# Patient Record
Sex: Male | Born: 1975 | Race: Black or African American | Hispanic: No | Marital: Married | Smoking: Current every day smoker
Health system: Southern US, Community
[De-identification: ages and names within clinical notes are randomized; demographics above are authoritative.]

## PROBLEM LIST (undated history)

## (undated) DIAGNOSIS — E119 Type 2 diabetes mellitus without complications: Secondary | ICD-10-CM

---

## 2005-06-05 ENCOUNTER — Emergency Department: Payer: Self-pay | Admitting: Emergency Medicine

## 2010-04-20 ENCOUNTER — Emergency Department: Payer: Self-pay | Admitting: Internal Medicine

## 2011-02-11 ENCOUNTER — Emergency Department: Payer: Self-pay | Admitting: Emergency Medicine

## 2014-03-28 ENCOUNTER — Emergency Department: Payer: Self-pay | Admitting: Emergency Medicine

## 2014-06-12 ENCOUNTER — Ambulatory Visit
Admission: EM | Admit: 2014-06-12 | Discharge: 2014-06-12 | Disposition: A | Discharge status: Home / Self Care | Payer: Self-pay | Attending: Family Medicine | Admitting: Family Medicine

## 2014-06-12 DIAGNOSIS — Z029 Encounter for administrative examinations, unspecified: Secondary | ICD-10-CM

## 2014-06-12 DIAGNOSIS — Z0289 Encounter for other administrative examinations: Secondary | ICD-10-CM

## 2014-06-12 LAB — DEPT OF TRANSP DIPSTICK, URINE (ARMC ONLY)
GLUCOSE, UA: NEGATIVE mg/dL
HGB URINE DIPSTICK: NEGATIVE
PROTEIN: NEGATIVE mg/dL
SPECIFIC GRAVITY, URINE: 1.03 (ref 1.005–1.030)

## 2014-06-12 NOTE — Discharge Instructions (Signed)
Recommend regular physicals, dental and vision checks.

## 2014-06-12 NOTE — ED Provider Notes (Signed)
SUBJECTIVE: Patient presents for DOT Physical. No acute problems.   OBJECTIVE: VItals and Exam on form   ASSESSMENT/PLAN: Form reviewed and filled out. Form will be scanned into system. Encouraged regular follow-up with PMD, dentist, and eye doctor. Recommended smoking cessation.   Paulina Fusi, MD 06/12/14 1113

## 2014-06-12 NOTE — ED Notes (Signed)
Here for DOT physical. No complaints.

## 2014-07-31 ENCOUNTER — Encounter (HOSPITAL_COMMUNITY): Payer: Self-pay | Admitting: *Deleted

## 2014-07-31 ENCOUNTER — Emergency Department (HOSPITAL_COMMUNITY)
Admission: EM | Admit: 2014-07-31 | Discharge: 2014-07-31 | Disposition: A | Discharge status: Home / Self Care | Payer: Self-pay | Attending: Emergency Medicine | Admitting: Emergency Medicine

## 2014-07-31 DIAGNOSIS — T675XXA Heat exhaustion, unspecified, initial encounter: Secondary | ICD-10-CM | POA: Insufficient documentation

## 2014-07-31 DIAGNOSIS — Y999 Unspecified external cause status: Secondary | ICD-10-CM | POA: Insufficient documentation

## 2014-07-31 DIAGNOSIS — Y9389 Activity, other specified: Secondary | ICD-10-CM | POA: Insufficient documentation

## 2014-07-31 DIAGNOSIS — R42 Dizziness and giddiness: Secondary | ICD-10-CM | POA: Insufficient documentation

## 2014-07-31 DIAGNOSIS — X30XXXA Exposure to excessive natural heat, initial encounter: Secondary | ICD-10-CM | POA: Insufficient documentation

## 2014-07-31 DIAGNOSIS — Y9289 Other specified places as the place of occurrence of the external cause: Secondary | ICD-10-CM | POA: Insufficient documentation

## 2014-07-31 DIAGNOSIS — R410 Disorientation, unspecified: Secondary | ICD-10-CM | POA: Insufficient documentation

## 2014-07-31 DIAGNOSIS — Z720 Tobacco use: Secondary | ICD-10-CM | POA: Insufficient documentation

## 2014-07-31 LAB — CBC
HEMATOCRIT: 46.3 % (ref 39.0–52.0)
Hemoglobin: 16 g/dL (ref 13.0–17.0)
MCH: 30.9 pg (ref 26.0–34.0)
MCHC: 34.6 g/dL (ref 30.0–36.0)
MCV: 89.6 fL (ref 78.0–100.0)
Platelets: 253 10*3/uL (ref 150–400)
RBC: 5.17 MIL/uL (ref 4.22–5.81)
RDW: 13.2 % (ref 11.5–15.5)
WBC: 19 10*3/uL — ABNORMAL HIGH (ref 4.0–10.5)

## 2014-07-31 LAB — BASIC METABOLIC PANEL
Anion gap: 10 (ref 5–15)
BUN: 11 mg/dL (ref 6–20)
CALCIUM: 9 mg/dL (ref 8.9–10.3)
CO2: 22 mmol/L (ref 22–32)
CREATININE: 1.25 mg/dL — AB (ref 0.61–1.24)
Chloride: 104 mmol/L (ref 101–111)
GFR calc non Af Amer: >60 mL/min (ref >60)
GLUCOSE: 137 mg/dL — AB (ref 65–99)
Potassium: 3.9 mmol/L (ref 3.5–5.1)
Sodium: 136 mmol/L (ref 135–145)

## 2014-07-31 MED ORDER — SODIUM CHLORIDE 0.9 % IV BOLUS (SEPSIS)
1000.0000 mL | INTRAVENOUS | Status: AC
  Administered 2014-07-31: 1000 mL via INTRAVENOUS

## 2014-07-31 NOTE — Discharge Instructions (Signed)
Heat-Related Illness °Heat-related illnesses occur when the body is unable to properly cool itself. The body normally cools itself by sweating. However, under some conditions sweating is not enough. In these cases, a person's body temperature rises rapidly. Very high body temperatures may damage the brain or other vital organs. Some examples of heat-related illnesses include: °· Heat stroke. This occurs when the body is unable to regulate its temperature. The body's temperature rises rapidly, the sweating mechanism fails, and the body is unable to cool down. Body temperature may rise to 106° F (41° C) or higher within 10 to 15 minutes. Heat stroke can cause death or permanent disability if emergency treatment is not provided. °· Heat exhaustion. This is a milder form of heat-related illness that can develop after several days of exposure to high temperatures and not enough fluids. It is the body's response to an excessive loss of the water and salt contained in sweat. °· Heat cramps. These usually affect people who sweat a lot during heavy activity. This sweating drains the body's salt and moisture. The low salt level in the muscles causes painful cramps. Heat cramps may also be a symptom of heat exhaustion. Heat cramps usually occur in the abdomen, arms, or legs. Get medical attention for cramps if you have heart problems or are on a low-sodium diet. °Those that are at greatest risk for heat-related illnesses include:  °· The elderly. °· Infant and the very young. °· People with mental illness and chronic diseases. °· People who are overweight (obese). °· Young and healthy people can even succumb to heat if they participate in strenuous physical activities during hot weather. °CAUSES  °Several factors affect the body's ability to cool itself during extremely hot weather. When the humidity is high, sweat will not evaporate as quickly. This prevents the body from releasing heat quickly. Other factors that can affect  the body's ability to cool down include:  °· Age. °· Obesity. °· Fever. °· Dehydration. °· Heart disease. °· Mental illness. °· Poor circulation. °· Sunburn. °· Prescription drug use. °· Alcohol use. °SYMPTOMS  °Heat stroke: Warning signs of heat stroke vary, but may include: °· An extremely high body temperature (above 103°F orally). °· A fast, strong pulse. °· Dizziness. °· Confusion. °· Red, hot, and dry skin. °· No sweating. °· Throbbing headache. °· Feeling sick to your stomach (nauseous). °· Unconsciousness. °Heat exhaustion: Warning signs of heat exhaustion include: °· Heavy sweating. °· Tiredness. °· Headache. °· Paleness. °· Weakness. °· Feeling sick to your stomach (nauseous) or vomiting. °· Muscle cramps. °Heat cramps °· Muscle pains or spasms. °TREATMENT  °Heat stroke °· Get into a cool environment. An indoor place that is air-conditioned may be best. °· Take a cool shower or bath. Have someone around to make sure you are okay. °· Take your temperature. Make sure it is going down. °Heat exhaustion °· Drink plenty of fluids. Do not drink liquids that contain caffeine, alcohol, or large amounts of sugar. These cause you to lose more body fluid. Also, avoid very cold drinks. They can cause stomach cramps. °· Get into a cool environment. An indoor place that is air-conditioned may be best. °· Take a cool shower or bath. Have someone around to make sure you are okay. °· Put on lightweight clothing. °Heat cramps °· Stop whatever activity you were doing. Do not attempt to do that activity for at least 3 hours after the cramps have gone away. °· Get into a cool environment. An indoor   place that is air-conditioned may be best. HOME CARE INSTRUCTIONS  To protect your health when temperatures are extremely high, follow these tips:  During heavy exercise in a hot environment, drink two to four glasses (16-32 ounces) of cool fluids each hour. Do not wait until you are thirsty to drink. Warning: If your caregiver  limits the amount of fluid you drink or has you on water pills, ask how much you should drink while the weather is hot.  Do not drink liquids that contain caffeine, alcohol, or large amounts of sugar. These cause you to lose more body fluid.  Avoid very cold drinks. They can cause stomach cramps.  Wear appropriate clothing. Choose lightweight, light-colored, loose-fitting clothing.  If you must be outdoors, try to limit your outdoor activity to morning and evening hours. Try to rest often in shady areas.  If you are not used to working or exercising in a hot environment, start slowly and pick up the pace gradually.  Stay cool in an air-conditioned place if possible. If your home does not have air conditioning, go to the shopping mall or [employer].  Taking a cool shower or bath may help you cool off. SEEK MEDICAL CARE IF:   You see any of the symptoms listed above. You may be dealing with a life-threatening emergency.  Symptoms worsen or last longer than 1 hour.  Heat cramps do not get better in 1 hour. MAKE SURE YOU:   Understand these instructions.  Will watch your condition.  Will get help right away if you are not doing well or get worse. Document Released: 10/21/2007 Document Revised: 04/05/2011 Document Reviewed: 10/21/2007 [facility] Patient Information 2015 [place], [place]. This information is not intended to replace advice given to you by your health care provider. Make sure you discuss any questions you have with your health care provider.  Emergency Department Resource Guide 1) Find a Doctor and Pay Out of Pocket Although you won't have to find out who is covered by your insurance plan, it is a good idea to ask around and get recommendations. You will then need to call the office and see if the doctor you have chosen will accept you as a new patient and what types of options they offer for patients who are self-pay. Some doctors offer discounts or will set up payment  plans for their patients who do not have insurance, but you will need to ask so you aren't surprised when you get to your appointment.  2) Contact Your Local Health Department Not all health departments have doctors that can see patients for sick visits, but many do, so it is worth a call to see if yours does. If you don't know where your local health department is, you can check in your phone book. The CDC also has a tool to help you locate your state's health department, and many state websites also have listings of all of their local health departments.  3) Find a [facility] If your illness is not likely to be very severe or complicated, you may want to try a walk in clinic. These are popping up all over the country in pharmacies, drugstores, and shopping centers. They're usually staffed by nurse practitioners or physician assistants that have been trained to treat common illnesses and complaints. They're usually fairly quick and inexpensive. However, if you have serious medical issues or chronic medical problems, these are probably not your best option.  No Primary Care Doctor: - Call Health Connect at  [phone] - they can help you locate a primary care doctor that  accepts your insurance, provides certain services, etc. - Physician Referral Service- [phone]  Chronic Pain Problems: Organization         Address  Phone   Notes  [facility]  [phone] Patients need to be referred by their primary care doctor.   Medication Assistance: Organization         Address  Phone   Notes  [facility] Medication [facility] [address] [place], [place] [address] [phone] --Must be a resident of [facility] -- Must have NO insurance coverage whatsoever (no Medicaid/ Medicare, etc.) -- The pt. MUST have a primary care doctor that directs their care regularly and follows them in the community   MedAssist  [phone]   [employer]   [phone]    Agencies that provide inexpensive medical care: Organization         Address  Phone   Notes  [place]  [phone]   [facility]    [phone]   [facility] [place], [place] [address] [phone]   [place] [address], [place] [phone]   Planned Parenthood    [phone]   [facility]    [phone]   [place] and [place] [address], [place] Phone:  [phone], Fax:  [phone] Hours of Operation:  9 am - 6 pm, M-F.  Also accepts Medicaid/Medicare and self-pay.  [facility] for [place] [address], [place] Phone: [phone], Fax: [phone]. Hours of Operation:  8:30 am - 5:30 pm, M-F.  Also accepts Medicaid and self-pay.  [facility] [place] [address], [place] Phone: [phone]   [place], [place], [place] [phone], Ext. [phone] Mondays & Thursdays: 7-9 AM.  First 15 patients are seen on a first come, first serve basis.    [place] Providers:  Organization         Address  Phone   Notes  [facility] [address], [place] [phone] Also accepts self-pay patients.  [facility] [address], [place]  [phone]   [place], [address], [place] [phone]   [facility] [address], [place] [phone]   Lucianne Lei [address], [place]   [phone] Only accepts [place] Access [place] patients after they have their name applied to their card.   Self-Pay (no insurance) in [facility]:  Organization         Address  Phone   Notes  Sickle Cell Patients, [facility] [place] [phone]   [facility] [place] [phone]   [facility] [place]  [place], [place], [place] [phone]   [facility]/Dr. Osei-Bonsu  [address], [place] or [address], [place] [phone] Phone number for both [place] and [place] locations is the same.  [facility]gent [facility] [address] [place] [phone]   [facility] [place]  or [address] [phone]   [facility] [place] [phone], phone; [phone], fax Sees patients 1st and 3rd Saturday of every month.  Must not qualify for public or private insurance (i.e. Medicaid, Medicare, [place] Health Choice, Veterans' Benefits)  Household income should be no more than 200% of the poverty level The clinic cannot treat you if you are pregnant or think you are pregnant  Sexually transmitted diseases are not treated at the clinic.    Dental Care: Organization         Address  Phone  Notes  [facility] Department of [facility] [phone] Accepts children up to age 38 who are enrolled in [place] or [place]; pregnant women with a Medicaid card; and children who have applied for Medicaid or [place] Health Choice, but were declined, whose parents can pay a reduced fee at time of service.  [facility] Department of [facility]  [address], [place] [phone] Accepts children up to age 94 who are enrolled in [place] or [place]; pregnant women with a Medicaid card; and children who have applied for Medicaid or [place] Health Choice, but were declined, whose parents can pay a reduced fee at time of service.  [place] Adult Dental Access PROGRAM  [place] [phone] Patients are  seen by appointment only. Walk-ins are not accepted. [place] will see patients 75 years of age and older. Monday - Tuesday (8am-5pm) Most Wednesdays (8:30-5pm) $30 per visit, cash only  [facility] Adult Dental Access PROGRAM  [address], [facility] [phone] Patients are seen by appointment only. Walk-ins are not accepted. [place] will see patients 60 years of age and older. One Wednesday Evening (Monthly: Volunteer Based).  $30 per visit, cash only  [place]  [phone] for adults; Children under age 69, call Graduate Pediatric Dentistry at [phone]. Children aged 12-14, please call [phone] to request a pediatric application.  Dental services are provided in all areas of dental care including fillings, crowns and bridges, complete and partial dentures, implants, gum treatment, root canals, and extractions. Preventive care is also provided. Treatment is provided to both adults and children. Patients are selected via a lottery and there is often a waiting list.   [facility] [address] [place]  [phone] [email or web address]   [facility] [address] [place], [place] [phone], Ext. [phone] Second and Fourth Thursday of each month, opens at 6:30 AM; Clinic ends at 9 AM.  Patients are seen on a first-come first-served basis, and a limited number are seen during each clinic.   [facility]  [address] Hillard Danker [place], [place] [phone]   Eligibility Requirements You must have lived in [place], [place], or [place] counties for at least the last three months.   You cannot be eligible for state or federal sponsored [employer], including [employer], [place], or [employer].   You generally cannot be eligible for healthcare insurance through your employer.    How to apply: Eligibility screenings are held every Tuesday and Wednesday afternoon from 1:00 pm until 4:00  pm. You do not need an appointment for the interview!  [facility] [address], [place], [place]   [place]  [place]  [place]  [phone]    Behavioral Health Resources in the Community: Intensive Outpatient Programs Organization         Address  Phone  Notes  [facility] [address] [place], [place] [phone]   [facility] Outpatient [address] [place], [place]   ADS: Alcohol & Drug Svcs [address], [place], [place]   [place] [address],  [place], [place] or [phone]   Substance Abuse Resources Organization         Address  Phone  Notes  Alcohol and Drug Services  [phone]   [place]  [phone]   [facility] [place]   Chinita Pester  [phone]   Residential & Outpatient Substance Abuse Program  [phone]   Psychological Services Organization         Address  Phone  Notes  [facility]  [place]  [phone]   [place] [address] [place] or [phone]    Mobile Crisis Teams Organization         Address  Phone  Notes  [facility], Mobile Crisis Care Unit  [phone]   [facility]  [address] [place], [place]   [place] Levels [address], [place] [phone]    Self-Help/Support Groups Organization         Address  Phone             Notes  [place]. of [place] - variety of support groups  [place] Call for more information  Narcotics Anonymous (NA), [facility] [address], [employer] [place]  2 meetings at this location   Special educational needs teacher          Address  Phone  Notes  [facility] [place],    [place]  [phone]   [facility]  [address], [place] [address], [place], [place]   [place], [place] [phone] Admissions: 8am-3pm M-F  Incentives Substance [place] [address]    [place], [place] [phone]   [facility] [address] [place], [place], [place]   [facility] [address],  [place], [place]   [facility] Programs - Intensive Outpatient [address], [place], [place]   [facility] [place].,  [place], [place] [phone] or [phone]   [facility] Services (RTS) [address], [place], [place] Accepts Medicaid  [facility] [place] [address],  [place] [phone] Substance Abuse/Addiction Treatment   [facility] Organization         Address  Phone  Notes  [facility]  [phone]   Domenic Schwab, PhD [address] Arlis [place], [place]   [phone] or [phone]   Maynard Crewe [place], [place] [phone]   [place] [address], [place], [place] [phone] Insurance/Medicaid/sponsorship through [employer] and Families [address], [place] [address]  [place], [place] [phone] [place], [place] [phone]    Dr. Adele Schilder  [phone]   [facility] [place] Dept. 1) [address] [place] 2) [place] 3)  [place] [address], [place] [phone]  [phone]   [place] [phone] or [phone] (After Hours)

## 2014-07-31 NOTE — ED Provider Notes (Signed)
CSN: [id number]     Arrival date & time 07/31/14  1739 History   First MD Initiated Contact with Patient 07/31/14 1750     Chief Complaint  Patient presents with  . Altered Mental Status  . Heat Exposure     (Consider location/radiation/quality/duration/timing/severity/associated sxs/prior Treatment) Patient is a 39 y.o. male presenting with altered mental status. The history is provided by the patient.  Altered Mental Status Presenting symptoms: confusion   Severity:  Mild Most recent episode:  Today Episode history:  Single Timing:  Constant Progression:  Resolved Chronicity:  New Context comment:  Got very hot while mowing Associated symptoms: light-headedness   Associated symptoms: no abdominal pain, no fever, no headaches, no nausea and no vomiting     History reviewed. No pertinent past medical history. No past surgical history on file. No family history on file. History  Substance Use Topics  . Smoking status: Current Every Day Smoker -- 0.50 packs/day  . Smokeless tobacco: Not on file  . Alcohol Use: Yes     Comment: Twice a week    Review of Systems  Constitutional: Negative for fever.  HENT: Negative for drooling and rhinorrhea.   Eyes: Negative for pain.  Respiratory: Negative for cough and shortness of breath.   Cardiovascular: Negative for chest pain and leg swelling.  Gastrointestinal: Negative for nausea, vomiting, abdominal pain and diarrhea.  Genitourinary: Negative for dysuria and hematuria.  Musculoskeletal: Negative for gait problem and neck pain.  Skin: Negative for color change.  Neurological: Positive for syncope (possibly) and light-headedness. Negative for numbness and headaches.  Hematological: Negative for adenopathy.  Psychiatric/Behavioral: Positive for confusion. Negative for behavioral problems.  All other systems reviewed and are negative.     Allergies  Review of patient's allergies indicates no known allergies.  Home  Medications   Prior to Admission medications   Medication Sig Start Date End Date Taking? Authorizing Provider  ibuprofen (ADVIL,MOTRIN) 200 MG tablet Take 200-400 mg by mouth every 6 (six) hours as needed for headache, mild pain or moderate pain.   Yes Historical Provider, MD   BP 131/85 mmHg  Pulse 99  Temp(Src) 99.1 F (37.3 C) (Oral)  Resp 17  SpO2 97% Physical Exam  Constitutional: He is oriented to person, place, and time. He appears well-developed and well-nourished.  HENT:  Head: Normocephalic and atraumatic.  Right Ear: External ear normal.  Left Ear: External ear normal.  Nose: Nose normal.  Mouth/Throat: Oropharynx is clear and moist. No oropharyngeal exudate.  Eyes: Conjunctivae and EOM are normal. Pupils are equal, round, and reactive to light.  Neck: Normal range of motion. Neck supple.  Cardiovascular: Normal rate, regular rhythm, normal heart sounds and intact distal pulses.  Exam reveals no gallop and no friction rub.   No murmur heard. Pulmonary/Chest: Effort normal and breath sounds normal. No respiratory distress. He has no wheezes.  Abdominal: Soft. Bowel sounds are normal. He exhibits no distension. There is no tenderness. There is no rebound and no guarding.  Musculoskeletal: Normal range of motion. He exhibits no edema or tenderness.  Neurological: He is alert and oriented to person, place, and time.  alert, oriented x3 speech: normal in context and clarity memory: intact grossly cranial nerves II-XII: intact motor strength: full proximally and distally no involuntary movements or tremors sensation: intact to light touch diffusely  cerebellar: finger-to-nose and heel-to-shin intact gait: normal forwards and backwards  Skin: Skin is warm and dry.  Psychiatric: He has a normal mood and  affect. His behavior is normal.  Nursing note and vitals reviewed.   ED Course  Procedures (including critical care time) Labs Review Labs Reviewed  CBC - Abnormal;  Notable for the following:    WBC 19.0 (*)    All other components within normal limits  BASIC METABOLIC PANEL - Abnormal; Notable for the following:    Glucose, Bld 137 (*)    Creatinine, Ser 1.25 (*)    All other components within normal limits    Imaging Review No results found.   EKG Interpretation   Date/Time:  Wednesday July 31 2014 17:56:19 EDT Ventricular Rate:  103 PR Interval:  156 QRS Duration: 85 QT Interval:  322 QTC Calculation: 421 R Axis:   -22 Text Interpretation:  Sinus tachycardia Borderline left axis deviation ST  elev, probable normal early repol pattern Confirmed by Marinus Eicher  MD,  Teretha Chalupa ([id number]) on 07/31/2014 6:17:18 PM      MDM   Final diagnoses:  Heat exhaustion, initial encounter    6:17 PM 39 y.o. male who presents with likely heat exhaustion. The patient states that he had been outside pushing a lawnmower for about 45 minutes when he began feeling lightheaded as if he might pass out. His symptoms persisted and the fire department was called. He was noted to not be sweating very much. He seemed confused. He believes that he may have passed out but is not sure. He denies any chest pain or shortness of breath. After some IV fluids and ice packs in route he is now alert and oriented 3 with a normal neurologic exam. He states he is feeling much better. Temperature of 99.1 with a heart rate in the low 100s on my exam. Vital signs otherwise unremarkable. Will treat with IV fluids and get screening lab work. Symptoms consistent with heat exhaustion.  7:52 PM: Leukocytosis, but labs otherwise unremarkable. HR has dec appropriately. Patient remains asymptomatic. I have discussed the diagnosis/risks/treatment options with the patient and believe the pt to be eligible for discharge home to follow-up with a pcp. We also discussed returning to the ED immediately if new or worsening sx occur. We discussed the sx which are most concerning (e.g., further syncope, cp, sob,  fever) that necessitate immediate return. Medications administered to the patient during their visit and any new prescriptions provided to the patient are listed below.  Medications given during this visit Medications  sodium chloride 0.9 % bolus 1,000 mL (1,000 mLs Intravenous New Bag/Given 07/31/14 1826)    New Prescriptions   No medications on file     Pamella Pert, MD 07/31/14 1954

## 2014-07-31 NOTE — ED Notes (Signed)
EMS contacted today by patients employer. Patient was working(mowing a lawn) when he walked away from [employer]. Patient was disoriented to self and place.100 NS ice packs applied to neck and groin area x 1 hour. Pt is now oriented x4.

## 2014-07-31 NOTE — ED Notes (Signed)
Bed: SN05 Expected date:  Expected time:  Means of arrival:  Comments: EMS/44M/disoriented/hot

## 2016-05-04 ENCOUNTER — Emergency Department
Admission: EM | Admit: 2016-05-04 | Discharge: 2016-05-04 | Disposition: A | Discharge status: Home / Self Care | Payer: Self-pay | Attending: Emergency Medicine | Admitting: Emergency Medicine

## 2016-05-04 ENCOUNTER — Encounter: Payer: Self-pay | Admitting: Emergency Medicine

## 2016-05-04 ENCOUNTER — Emergency Department: Payer: Self-pay

## 2016-05-04 DIAGNOSIS — J209 Acute bronchitis, unspecified: Secondary | ICD-10-CM | POA: Insufficient documentation

## 2016-05-04 DIAGNOSIS — F17210 Nicotine dependence, cigarettes, uncomplicated: Secondary | ICD-10-CM | POA: Insufficient documentation

## 2016-05-04 MED ORDER — IPRATROPIUM-ALBUTEROL 0.5-2.5 (3) MG/3ML IN SOLN
3.0000 mL | Freq: Once | RESPIRATORY_TRACT | Status: AC
  Administered 2016-05-04: 3 mL via RESPIRATORY_TRACT
  Filled 2016-05-04: qty 3

## 2016-05-04 MED ORDER — AZITHROMYCIN 250 MG PO TABS: ORAL_TABLET | ORAL | 0 refills | Status: DC

## 2016-05-04 MED ORDER — PREDNISONE 10 MG PO TABS
ORAL_TABLET | ORAL | 0 refills | Status: DC
Start: 2016-05-04 — End: 2021-07-01

## 2016-05-04 MED ORDER — ALBUTEROL SULFATE HFA 108 (90 BASE) MCG/ACT IN AERS: 2.0000 | INHALATION_SPRAY | Freq: Four times a day (QID) | RESPIRATORY_TRACT | 2 refills | Status: DC | PRN

## 2016-05-04 NOTE — Discharge Instructions (Signed)
Discontinue smoking. Begin taking Z-Pak as directed. Prednisone 6 day taper starting today. Also albuterol inhaler as needed for wheezing or shortness of breath. Increase fluids. Tylenol if needed for fever, headache, body aches.

## 2016-05-04 NOTE — ED Provider Notes (Signed)
[facility] Emergency Department Provider Note   ____________________________________________   First MD Initiated Contact with Patient 05/04/16 [phone]     (approximate)  I have reviewed the triage vital signs and the nursing notes.   HISTORY  Chief Complaint Cough and Wheezing    HPI Patrick [name] is a 41 y.o. male is here with complaint of cough, sore throat and subjective fever for the last 3 weeks. Patient has not actually taken his temperature but has felt warm. Patient states that last night he was unable to sleep due to wheezing. He has tried over-the-counter medications without any relief. Patient is a smoker. He denies any previous problems with pneumonia but has had bronchitis in the past. He denies any other respiratory symptoms. He rates his pain as 5/10.   History reviewed. No pertinent past medical history.  There are no active problems to display for this patient.   History reviewed. No pertinent surgical history.  Prior to Admission medications   Medication Sig Start Date End Date Taking? Authorizing Provider  albuterol (PROVENTIL HFA;VENTOLIN HFA) 108 (90 Base) MCG/ACT inhaler Inhale 2 puffs into the lungs every 6 (six) hours as needed for wheezing or shortness of breath. 05/04/16   Johnn Hai, PA-C  azithromycin (ZITHROMAX Z-PAK) 250 MG tablet Take 2 tablets (500 mg) on  Day 1,  followed by 1 tablet (250 mg) once daily on Days 2 through 5. 05/04/16   Johnn Hai, PA-C  predniSONE (DELTASONE) 10 MG tablet Take 6 tablets  today, on day 2 take 5 tablets, day 3 take 4 tablets, day 4 take 3 tablets, day 5 take  2 tablets and 1 tablet the last day 05/04/16   Johnn Hai, PA-C    Allergies Patient has no known allergies.  No family history on file.  Social History Social History  Substance Use Topics  . Smoking status: Current Every Day Smoker    Packs/day: 0.50  . Smokeless tobacco: Never Used  . Alcohol use Yes   Comment: Twice a week    Review of Systems Constitutional: Subjective fever/chills Eyes: No visual changes. ENT: Positive sore throat. Cardiovascular: Denies chest pain. Respiratory: Denies shortness of breath.Positive for cough and wheezing. Gastrointestinal: No abdominal pain.  No nausea, no vomiting.  No diarrhea.   Skin: Negative for rash. Neurological: Negative for headaches, focal weakness or numbness.  10-point ROS otherwise negative.  ____________________________________________   PHYSICAL EXAM:  VITAL SIGNS: ED Triage Vitals  Enc Vitals Group     BP 05/04/16 0936 115/65     Pulse Rate 05/04/16 0934 76     Resp 05/04/16 0934 (!) 22     Temp 05/04/16 0937 98.3 F (36.8 C)     Temp Source 05/04/16 0937 Oral     SpO2 05/04/16 0934 99 %     Weight 05/04/16 0935 230 lb (104.3 kg)     Height 05/04/16 0935 5\' 6"  (1.676 m)     Head Circumference --      Peak Flow --      Pain Score 05/04/16 0938 5     Pain Loc --      Pain Edu? --      Excl. in [place]? --     Constitutional: Alert and oriented. Well appearing and in no acute distress. Eyes: Conjunctivae are normal. PERRL. EOMI. Head: Atraumatic. Nose: No congestion/rhinnorhea. Mouth/Throat: Mucous membranes are moist.  Oropharynx non-erythematous. Neck: No stridor.   Hematological/Lymphatic/Immunilogical: No cervical lymphadenopathy.  Cardiovascular: Normal rate, regular rhythm. Grossly normal heart sounds.  Good peripheral circulation. Respiratory: Normal respiratory effort.  No retractions. Lungs Bilaterally have a faint expiratory wheeze noted with deep inspiration. Non-productive cough during exam. Gastrointestinal: Soft and nontender. No distention. No abdominal bruits. No CVA tenderness. Musculoskeletal: Moves upper and lower extremities without difficulty. Normal gait was noted. Neurologic:  Normal speech and language. No gross focal neurologic deficits are appreciated. No gait instability. Skin:  Skin is warm,  dry and intact. No rash noted. Psychiatric: Mood and affect are normal. Speech and behavior are normal.  ____________________________________________   LABS (all labs ordered are listed, but only abnormal results are displayed)  Labs Reviewed - No data to display ____________________________________________  EKG  Normal sinus rhythm with a rate of 82. PR interval 156, QRS duration 84. EKG was reviewed by Dr. Corky Downs. ____________________________________________  RADIOLOGY Chest x-ray per radiologist no active cardiopulmonary disease. I, Johnn Hai, personally viewed and evaluated these images (plain radiographs) as part of my medical decision making, as well as reviewing the written report by the radiologist.  ____________________________________________   PROCEDURES  Procedure(s) performed: None  Procedures  Critical Care performed: No  ____________________________________________   INITIAL IMPRESSION / ASSESSMENT AND PLAN / ED COURSE  Pertinent labs & imaging results that were available during my care of the patient were reviewed by me and considered in my medical decision making (see chart for details).  Patient was given a nebulizer treatment while in the emergency room which opened him up a great deal and help with his cough. Patient was given a prescription for Zithromax, prednisone 6 day pack, and an albuterol inhaler as needed for cough and wheeze. He is encouraged to follow-up with [facility] where his PCP is. He is increase fluids and decrease smoking. He may continue taking Tylenol as needed for fever or throat pain.      ____________________________________________   FINAL CLINICAL IMPRESSION(S) / ED DIAGNOSES  Final diagnoses:  Acute bronchitis, unspecified organism  Cigarette smoker      NEW MEDICATIONS STARTED DURING THIS VISIT:  Discharge Medication List as of 05/04/2016 11:07 AM    START taking these medications   Details    albuterol (PROVENTIL HFA;VENTOLIN HFA) 108 (90 Base) MCG/ACT inhaler Inhale 2 puffs into the lungs every 6 (six) hours as needed for wheezing or shortness of breath., Starting Tue 05/04/2016, Print    azithromycin (ZITHROMAX Z-PAK) 250 MG tablet Take 2 tablets (500 mg) on  Day 1,  followed by 1 tablet (250 mg) once daily on Days 2 through 5., Print    predniSONE (DELTASONE) 10 MG tablet Take 6 tablets  today, on day 2 take 5 tablets, day 3 take 4 tablets, day 4 take 3 tablets, day 5 take  2 tablets and 1 tablet the last day, Print         Note:  This document was prepared using Dragon voice recognition software and may include unintentional dictation errors.    Johnn Hai, PA-C 05/04/16 1200    Lavonia Drafts, MD 05/04/16 1434

## 2016-05-04 NOTE — ED Triage Notes (Signed)
Says sick for 3 weeks with cough. Sore throat.  Last night he could not sleep due to wheezing . Tried otc without relief.  Says chest pain upper mid chest with coughing.

## 2017-09-01 ENCOUNTER — Other Ambulatory Visit: Payer: Self-pay

## 2017-09-01 ENCOUNTER — Encounter: Payer: Self-pay | Admitting: Intensive Care

## 2017-09-01 ENCOUNTER — Emergency Department
Admission: EM | Admit: 2017-09-01 | Discharge: 2017-09-01 | Disposition: A | Discharge status: Home / Self Care | Payer: Self-pay | Attending: Emergency Medicine | Admitting: Emergency Medicine

## 2017-09-01 DIAGNOSIS — E119 Type 2 diabetes mellitus without complications: Secondary | ICD-10-CM | POA: Insufficient documentation

## 2017-09-01 DIAGNOSIS — F17210 Nicotine dependence, cigarettes, uncomplicated: Secondary | ICD-10-CM | POA: Insufficient documentation

## 2017-09-01 DIAGNOSIS — R1010 Upper abdominal pain, unspecified: Secondary | ICD-10-CM | POA: Insufficient documentation

## 2017-09-01 LAB — COMPREHENSIVE METABOLIC PANEL
ALBUMIN: 4.1 g/dL (ref 3.5–5.0)
ALK PHOS: 89 U/L (ref 38–126)
ALT: 82 U/L — ABNORMAL HIGH (ref 0–44)
ANION GAP: 12 (ref 5–15)
AST: 63 U/L — ABNORMAL HIGH (ref 15–41)
BUN: 10 mg/dL (ref 6–20)
CALCIUM: 9 mg/dL (ref 8.9–10.3)
CHLORIDE: 93 mmol/L — AB (ref 98–111)
CO2: 24 mmol/L (ref 22–32)
Creatinine, Ser: 0.92 mg/dL (ref 0.61–1.24)
GFR calc Af Amer: >60 mL/min (ref >60)
GFR calc non Af Amer: >60 mL/min (ref >60)
GLUCOSE: 410 mg/dL — AB (ref 70–99)
POTASSIUM: 4.2 mmol/L (ref 3.5–5.1)
Sodium: 129 mmol/L — ABNORMAL LOW (ref 135–145)
Total Bilirubin: 1.8 mg/dL — ABNORMAL HIGH (ref 0.3–1.2)
Total Protein: 7.4 g/dL (ref 6.5–8.1)

## 2017-09-01 LAB — GLUCOSE, CAPILLARY
Glucose-Capillary: 405 mg/dL — ABNORMAL HIGH (ref 70–99)
Glucose-Capillary: 327 mg/dL — ABNORMAL HIGH (ref 70–99)

## 2017-09-01 LAB — CBC
HEMATOCRIT: 40.8 % (ref 40.0–52.0)
Hemoglobin: 14.2 g/dL (ref 13.0–18.0)
MCH: 31.2 pg (ref 26.0–34.0)
MCHC: 34.7 g/dL (ref 32.0–36.0)
MCV: 89.9 fL (ref 80.0–100.0)
Platelets: 208 10*3/uL (ref 150–440)
RBC: 4.54 MIL/uL (ref 4.40–5.90)
RDW: 13.6 % (ref 11.5–14.5)
WBC: 9.2 10*3/uL (ref 3.8–10.6)

## 2017-09-01 LAB — URINALYSIS, COMPLETE (UACMP) WITH MICROSCOPIC
BILIRUBIN URINE: NEGATIVE
Bacteria, UA: NONE SEEN
Hgb urine dipstick: NEGATIVE
KETONES UR: 80 mg/dL — AB
Leukocytes, UA: NEGATIVE
NITRITE: NEGATIVE
Protein, ur: NEGATIVE mg/dL
Specific Gravity, Urine: 1.031 — ABNORMAL HIGH (ref 1.005–1.030)
pH: 5 (ref 5.0–8.0)

## 2017-09-01 LAB — LIPASE, BLOOD: LIPASE: 46 U/L (ref 11–51)

## 2017-09-01 MED ORDER — METFORMIN HCL 500 MG PO TABS
500.0000 mg | ORAL_TABLET | Freq: Once | ORAL | Status: AC
  Administered 2017-09-01: 500 mg via ORAL
  Filled 2017-09-01 (×2): qty 1

## 2017-09-01 MED ORDER — METFORMIN HCL 500 MG PO TABS: 500.0000 mg | ORAL_TABLET | Freq: Two times a day (BID) | ORAL | 11 refills | Status: DC

## 2017-09-01 MED ORDER — SODIUM CHLORIDE 0.9 % IV BOLUS
1000.0000 mL | Freq: Once | INTRAVENOUS | Status: AC
  Administered 2017-09-01: 1000 mL via INTRAVENOUS

## 2017-09-01 MED ORDER — SODIUM CHLORIDE 0.9 % IV SOLN
Freq: Once | INTRAVENOUS | Status: AC
  Administered 2017-09-01: 14:00:00 via INTRAVENOUS

## 2017-09-01 NOTE — ED Provider Notes (Addendum)
[facility] Emergency Department Provider Note       Time seen: ----------------------------------------- 1:41 PM on 09/01/2017 -----------------------------------------   I have reviewed the triage vital signs and the nursing notes.  HISTORY   Chief Complaint Abdominal Pain and Hyperglycemia    HPI Patrick [name] is a 42 y.o. male with significant past medical history who presents to the ED for frequent urination and frequent thirst for the past 3 weeks.  Patient states he wakes up about every hour to urinate in the night.  He is concerned he may have diabetes, he has a strong family history of same.  He does not currently have a primary care doctor.  He also presents for a knot in his upper abdominal area there is concern might be a hernia.  He denies fevers, chills, vomiting or diarrhea.  History reviewed. No pertinent past medical history.  There are no active problems to display for this patient.   History reviewed. No pertinent surgical history.  Allergies Patient has no known allergies.  Social History Social History   Tobacco Use  . Smoking status: Current Every Day Smoker    Packs/day: 0.50    Types: Cigarettes  . Smokeless tobacco: Never Used  Substance Use Topics  . Alcohol use: Yes    Alcohol/week: 36.0 standard drinks    Types: 36 Cans of beer per week  . Drug use: Yes    Types: Marijuana   Review of Systems Constitutional: Negative for fever. Eyes: Positive for blurry vision ENT: Positive for frequent thirst Cardiovascular: Negative for chest pain. Respiratory: Negative for shortness of breath. Gastrointestinal: Negative for abdominal pain, vomiting and diarrhea. Genitourinary: Positive for polyuria Musculoskeletal: Negative for back pain. Skin: Negative for rash. Neurological: Negative for headaches, focal weakness or numbness.  All systems negative/normal/unremarkable except as stated in the  HPI  ____________________________________________   PHYSICAL EXAM:  VITAL SIGNS: ED Triage Vitals  Enc Vitals Group     BP 09/01/17 1152 125/87     Pulse Rate 09/01/17 1152 91     Resp 09/01/17 1152 16     Temp 09/01/17 1152 98.7 F (37.1 C)     Temp Source 09/01/17 1152 Oral     SpO2 09/01/17 1152 99 %     Weight 09/01/17 1153 207 lb (93.9 kg)     Height 09/01/17 1153 5\' 5"  (1.651 m)     Head Circumference --      Peak Flow --      Pain Score 09/01/17 1153 4     Pain Loc --      Pain Edu? --      Excl. in [place]? --    Constitutional: Alert and oriented. Well appearing and in no distress. Eyes: Conjunctivae are normal. Normal extraocular movements. ENT   Head: Normocephalic and atraumatic.   Nose: No congestion/rhinnorhea.   Mouth/Throat: Mucous membranes are moist.   Neck: No stridor. Cardiovascular: Normal rate, regular rhythm. No murmurs, rubs, or gallops. Respiratory: Normal respiratory effort without tachypnea nor retractions. Breath sounds are clear and equal bilaterally. No wheezes/rales/rhonchi. Gastrointestinal: Soft and nontender. Normal bowel sounds Musculoskeletal: Nontender with normal range of motion in extremities. No lower extremity tenderness nor edema. Neurologic:  Normal speech and language. No gross focal neurologic deficits are appreciated.  Skin:  Skin is warm, dry and intact. No rash noted. Psychiatric: Mood and affect are normal. Speech and behavior are normal.  ____________________________________________  ED COURSE:  As part of my medical decision  making, I reviewed the following data within the [place] History obtained from family if available, nursing notes, old chart and ekg, as well as notes from prior ED visits. Patient presented for likely new onset diabetes, we will assess with labs and give IV fluids.   Procedures ____________________________________________   LABS (pertinent positives/negatives)  Labs  Reviewed  GLUCOSE, CAPILLARY - Abnormal; Notable for the following components:      Result Value   Glucose-Capillary 405 (*)    All other components within normal limits  COMPREHENSIVE METABOLIC PANEL - Abnormal; Notable for the following components:   Sodium 129 (*)    Chloride 93 (*)    Glucose, Bld 410 (*)    AST 63 (*)    ALT 82 (*)    Total Bilirubin 1.8 (*)    All other components within normal limits  URINALYSIS, COMPLETE (UACMP) WITH MICROSCOPIC - Abnormal; Notable for the following components:   Color, Urine STRAW (*)    APPearance CLEAR (*)    Specific Gravity, Urine 1.031 (*)    Glucose, UA >=500 (*)    Ketones, ur 80 (*)    All other components within normal limits  GLUCOSE, CAPILLARY - Abnormal; Notable for the following components:   Glucose-Capillary 327 (*)    All other components within normal limits  LIPASE, BLOOD  CBC  CBG MONITORING, ED  CBG MONITORING, ED  ____________________________________________  DIFFERENTIAL DIAGNOSIS   Dehydration, electrolyte abnormality, hyperglycemia, DKA or HHNK unlikely  FINAL ASSESSMENT AND PLAN  Type 2 diabetes, dehydration   Plan: The patient had presented for hyperglycemia with a blood sugar over 400. Patient's labs did indicate some degree of dehydration and hyperglycemia which was treated with fluids and we started him on metformin.  Overall he looks well clinically, he will be given prescription for metformin and referral for close outpatient follow-up.   Laurence Aly, MD   Note: This note was generated in part or whole with voice recognition software. Voice recognition is usually quite accurate but there are transcription errors that can and very often do occur. I apologize for any typographical errors that were not detected and corrected.     Earleen Newport, MD 09/01/17 1343    Earleen Newport, MD 09/01/17 1415

## 2017-09-01 NOTE — ED Notes (Signed)
Pt presents with frequent urination, thirst x 3 weeks. Pt concerned that he may have diabetes. Pt does not have pcp. Pt alert & oriented with NAD noted.

## 2017-09-01 NOTE — ED Triage Notes (Addendum)
Patient states "I have a knot in my stomach and I dont know if its a hernia or what and all night long every hour I need to pee and my mouth stays dry all the time. Also blurry vision on and off" This RN asked if patient was diabetic and he responded "that's what I want to find out" Ambulatory in triage with no problems. A&O x4

## 2018-11-30 ENCOUNTER — Other Ambulatory Visit: Payer: Self-pay

## 2018-11-30 ENCOUNTER — Encounter: Payer: Self-pay | Admitting: Emergency Medicine

## 2018-11-30 DIAGNOSIS — R251 Tremor, unspecified: Secondary | ICD-10-CM | POA: Diagnosis not present

## 2018-11-30 DIAGNOSIS — F17210 Nicotine dependence, cigarettes, uncomplicated: Secondary | ICD-10-CM | POA: Insufficient documentation

## 2018-11-30 DIAGNOSIS — E1165 Type 2 diabetes mellitus with hyperglycemia: Secondary | ICD-10-CM | POA: Diagnosis present

## 2018-11-30 DIAGNOSIS — E1169 Type 2 diabetes mellitus with other specified complication: Secondary | ICD-10-CM | POA: Diagnosis not present

## 2018-11-30 LAB — CBC
HCT: 47.9 % (ref 39.0–52.0)
Hemoglobin: 15.9 g/dL (ref 13.0–17.0)
MCH: 30.2 pg (ref 26.0–34.0)
MCHC: 33.2 g/dL (ref 30.0–36.0)
MCV: 91.1 fL (ref 80.0–100.0)
Platelets: 258 10*3/uL (ref 150–400)
RBC: 5.26 MIL/uL (ref 4.22–5.81)
RDW: 12.3 % (ref 11.5–15.5)
WBC: 13.6 10*3/uL — ABNORMAL HIGH (ref 4.0–10.5)
nRBC: 0 % (ref 0.0–0.2)

## 2018-11-30 LAB — COMPREHENSIVE METABOLIC PANEL
ALT: 63 U/L — ABNORMAL HIGH (ref 0–44)
AST: 57 U/L — ABNORMAL HIGH (ref 15–41)
Albumin: 3.5 g/dL (ref 3.5–5.0)
Alkaline Phosphatase: 68 U/L (ref 38–126)
Anion gap: 9 (ref 5–15)
BUN: 9 mg/dL (ref 6–20)
CO2: 24 mmol/L (ref 22–32)
Calcium: 8.8 mg/dL — ABNORMAL LOW (ref 8.9–10.3)
Chloride: 103 mmol/L (ref 98–111)
Creatinine, Ser: 0.84 mg/dL (ref 0.61–1.24)
GFR calc Af Amer: >60 mL/min (ref >60)
GFR calc non Af Amer: >60 mL/min (ref >60)
Glucose, Bld: 134 mg/dL — ABNORMAL HIGH (ref 70–99)
Potassium: 4 mmol/L (ref 3.5–5.1)
Sodium: 136 mmol/L (ref 135–145)
Total Bilirubin: 0.5 mg/dL (ref 0.3–1.2)
Total Protein: 6.4 g/dL — ABNORMAL LOW (ref 6.5–8.1)

## 2018-11-30 LAB — GLUCOSE, CAPILLARY: Glucose-Capillary: 138 mg/dL — ABNORMAL HIGH (ref 70–99)

## 2018-11-30 NOTE — ED Triage Notes (Signed)
Pt here with c/o being at work this evening, all the sudden began shaking, took a piece of candy, stopped shaking, about 10 minutes later, it started again. Pt is diabetic, has not checked his sugar today, has not been taking his diabetic meds or checking sugar since his med ran out. NAD. States he "feels fine" at this time.

## 2018-12-01 ENCOUNTER — Emergency Department
Admission: EM | Admit: 2018-12-01 | Discharge: 2018-12-01 | Disposition: A | Discharge status: Home / Self Care | Payer: 59 | Attending: Emergency Medicine | Admitting: Emergency Medicine

## 2018-12-01 DIAGNOSIS — E1169 Type 2 diabetes mellitus with other specified complication: Secondary | ICD-10-CM

## 2018-12-01 HISTORY — DX: Type 2 diabetes mellitus without complications: E11.9

## 2018-12-01 LAB — GLUCOSE, CAPILLARY: Glucose-Capillary: 113 mg/dL — ABNORMAL HIGH (ref 70–99)

## 2018-12-01 MED ORDER — METFORMIN HCL 500 MG PO TABS: 500.0000 mg | ORAL_TABLET | Freq: Two times a day (BID) | ORAL | 0 refills | Status: DC

## 2018-12-01 NOTE — ED Notes (Signed)
Pt calmly sitting in bed. Dry. A&Ox4. In NAD. Pt denies any symptoms related to earlier episodes.

## 2018-12-01 NOTE — ED Provider Notes (Signed)
[facility] Emergency Department Provider Note  ____________________________________________   First MD Initiated Contact with Patient 12/01/18 0011     (approximate)  I have reviewed the triage vital signs and the nursing notes.   HISTORY  Chief Complaint Hyperglycemia    HPI Patrick [name] is a 43 y.o. male with diabetes on Metformin who presents with some shaking.  Patient was at work this evening when he started having some shaking in his arms.  He took a piece of a candy and that he stopped shaking about 10 minutes later it started again but stopped soon after.  Unclear what brought it on, better on its own, mild..  Patient is diabetic but is not been checking his sugars.  Stop taking his diabetic meds because he ran out of them.  He has been having sugars in the 200s to 300s at home.  Last time he was here 1 year ago his sugar was in the 400s.  He denies any chest pain, shortness of breath, cough, urinary symptoms, abdominal pain.  He did have 2 episodes of vomiting this morning that were nonbloody nonbilious and he thinks was related to what he ate.  He denies any recurrent vomiting.  He denies any abdominal pain.          Past Medical History:  Diagnosis Date   Diabetes mellitus without complication ([place])     There are no active problems to display for this patient.   History reviewed. No pertinent surgical history.  Prior to Admission medications   Medication Sig Start Date End Date Taking? Authorizing Provider  albuterol (PROVENTIL HFA;VENTOLIN HFA) 108 (90 Base) MCG/ACT inhaler Inhale 2 puffs into the lungs every 6 (six) hours as needed for wheezing or shortness of breath. 05/04/16   Johnn Hai, PA-C  azithromycin (ZITHROMAX Z-PAK) 250 MG tablet Take 2 tablets (500 mg) on  Day 1,  followed by 1 tablet (250 mg) once daily on Days 2 through 5. 05/04/16   Johnn Hai, PA-C  metFORMIN (GLUCOPHAGE) 500 MG tablet Take 1 tablet (500 mg  total) by mouth 2 (two) times daily with a meal. 09/01/17 09/01/18  Earleen Newport, MD  predniSONE (DELTASONE) 10 MG tablet Take 6 tablets  today, on day 2 take 5 tablets, day 3 take 4 tablets, day 4 take 3 tablets, day 5 take  2 tablets and 1 tablet the last day 05/04/16   Johnn Hai, PA-C    Allergies Patient has no known allergies.  No family history on file.  Social History Social History   Tobacco Use   Smoking status: Current Every Day Smoker    Packs/day: 0.50    Types: Cigarettes   Smokeless tobacco: Never Used  Substance Use Topics   Alcohol use: Yes    Alcohol/week: 36.0 standard drinks    Types: 36 Cans of beer per week   Drug use: Yes    Types: Marijuana      Review of Systems Constitutional: No fever/chills Eyes: No visual changes. ENT: No sore throat. Cardiovascular: Denies chest pain. Respiratory: Denies shortness of breath. Gastrointestinal: No abdominal pain.  Vomiting now resolved.  No diarrhea.  No constipation. Genitourinary: Negative for dysuria. Musculoskeletal: Negative for back pain. Skin: Negative for rash. Neurological: Negative for headaches, focal weakness or numbness.  Shakiness All other ROS negative ____________________________________________   PHYSICAL EXAM:  VITAL SIGNS: ED Triage Vitals  Enc Vitals Group     BP 11/30/18 1927 131/77  Pulse Rate 11/30/18 1927 90     Resp 11/30/18 1927 18     Temp 11/30/18 1927 98.1 F (36.7 C)     Temp Source 11/30/18 1927 Oral     SpO2 11/30/18 1927 98 %     Weight 11/30/18 1928 223 lb (101.2 kg)     Height 11/30/18 1928 5\' 5"  (1.651 m)     Head Circumference --      Peak Flow --      Pain Score 11/30/18 1927 3     Pain Loc --      Pain Edu? --      Excl. in [place]? --     Constitutional: Alert and oriented. Well appearing and in no acute distress. Eyes: Conjunctivae are normal. EOMI. Head: Atraumatic. Nose: No congestion/rhinnorhea. Mouth/Throat: Mucous membranes are  moist.   Neck: No stridor. Trachea Midline. FROM Cardiovascular: Normal rate, regular rhythm. Grossly normal heart sounds.  Good peripheral circulation. Respiratory: Normal respiratory effort.  No retractions. Lungs CTAB. Gastrointestinal: Soft and nontender. No distention. No abdominal bruits.  Ventral hernia Musculoskeletal: No lower extremity tenderness nor edema.  No joint effusions. Neurologic:  Normal speech and language.  Cranial nerves II through XII are intact.  Equal strength in his arms and legs.  Sensations intact bilaterally. Skin:  Skin is warm, dry and intact. No rash noted. Psychiatric: Mood and affect are normal. Speech and behavior are normal. GU: Deferred   ____________________________________________   LABS (all labs ordered are listed, but only abnormal results are displayed)  Labs Reviewed  GLUCOSE, CAPILLARY - Abnormal; Notable for the following components:      Result Value   Glucose-Capillary 138 (*)    All other components within normal limits  CBC - Abnormal; Notable for the following components:   WBC 13.6 (*)    All other components within normal limits  COMPREHENSIVE METABOLIC PANEL - Abnormal; Notable for the following components:   Glucose, Bld 134 (*)    Calcium 8.8 (*)    Total Protein 6.4 (*)    AST 57 (*)    ALT 63 (*)    All other components within normal limits  CBG MONITORING, ED   ____________________________________________  INITIAL IMPRESSION / ASSESSMENT AND PLAN / ED COURSE  Patrick [name] was evaluated in Emergency Department on 12/01/2018 for the symptoms described in the history of present illness. He was evaluated in the context of the global COVID-19 pandemic, which necessitated consideration that the patient might be at risk for infection with the SARS-CoV-2 virus that causes COVID-19. Institutional protocols and algorithms that pertain to the evaluation of patients at risk for COVID-19 are in a state of rapid change based on  information released by regulatory bodies including the CDC and federal and state organizations. These policies and algorithms were followed during the patient's care in the ED.    Patient is a well-appearing 43 year old who is currently asymptomatic with normal vital signs.  He had 2 episodes of some shaking that have since resolved.  Possibly secondary to low sugars although he has been off his Metformin.  Will get labs evaluate for AKI, electrolyte abnormalities, hyperglycemia, hypoglycemia.  Patient denies any infectious symptoms to suggest UTI, pneumonia, Covid.  He had some vomiting that is since resolved and he is no abdominal tenderness on examination.    Pts sugar was 134.  Patient does not eat much today.  He says it prior his sugars have been in the 200s to 300s so  we will restart his Metformin.  I discussed with patient checking his sugars and if they remain over 200 he should restart it.  He plans on getting a primary care doctor to follow-up on.  His LFTs are slightly elevated but baseline from 1 year ago.  No right upper quadrant tenderness to suggest cholecystitis.   Patient is requesting work note.     ____________________________________________   FINAL CLINICAL IMPRESSION(S) / ED DIAGNOSES   Final diagnoses:  Type 2 diabetes mellitus with other specified complication, without long-term current use of insulin ([place])      MEDICATIONS GIVEN DURING THIS VISIT:  Medications - No data to display   ED Discharge Orders         Ordered    metFORMIN (GLUCOPHAGE) 500 MG tablet  2 times daily with meals     12/01/18 0024           Note:  This document was prepared using Dragon voice recognition software and may include unintentional dictation errors.   Vanessa [place], MD 12/01/18 Shelah Lewandowsky

## 2018-12-01 NOTE — Discharge Instructions (Addendum)
Your labs were reassuring except for slightly elevated white count but you have no infectious symptoms.  You also had slight elevation in your liver function test, these look stable from 1 year ago but this can be followed up with your primary doctor.  You do have a ventral hernia.  Continue to check your sugars and if they remain over 200 you should restart your Metformin.  I will have prescribed you a month supply  Return to the ER for any other concerns otherwise follow-up with your primary care doctor.

## 2019-02-04 ENCOUNTER — Ambulatory Visit
Admission: EM | Admit: 2019-02-04 | Discharge: 2019-02-04 | Disposition: A | Discharge status: Home / Self Care | Payer: 59 | Attending: Family Medicine | Admitting: Family Medicine

## 2019-02-04 DIAGNOSIS — F17210 Nicotine dependence, cigarettes, uncomplicated: Secondary | ICD-10-CM

## 2019-02-04 DIAGNOSIS — Z20822 Contact with and (suspected) exposure to covid-19: Secondary | ICD-10-CM

## 2019-02-04 NOTE — ED Triage Notes (Signed)
Work place want patient to get Covid test.

## 2019-02-04 NOTE — ED Provider Notes (Signed)
[facility] ____________________________________________  Time seen: Approximately 5:01 PM  I have reviewed the triage vital signs and the nursing notes.   HISTORY  Chief Complaint covid exposure   HPI Patrick [name] is a 44 y.o. male presenting for COVID-19 testing.  Patient reports he was potentially exposed from a coworker last week.  States last around a coworker on Wednesday.  Patient states that he feels well.  Denies cough, congestion, sore throat, chest pain, shortness of breath, vomiting, diarrhea or changes in taste or smell.  Denies aggravating or alleviating factors.   Past Medical History:  Diagnosis Date  . Diabetes mellitus without complication ([place])     There are no problems to display for this patient.   History reviewed. No pertinent surgical history.   No current facility-administered medications for this encounter.  Current Outpatient Medications:  .  albuterol (PROVENTIL HFA;VENTOLIN HFA) 108 (90 Base) MCG/ACT inhaler, Inhale 2 puffs into the lungs every 6 (six) hours as needed for wheezing or shortness of breath., Disp: 1 Inhaler, Rfl: 2 .  metFORMIN (GLUCOPHAGE) 500 MG tablet, Take 1 tablet (500 mg total) by mouth 2 (two) times daily with a meal., Disp: 60 tablet, Rfl: 0 .  azithromycin (ZITHROMAX Z-PAK) 250 MG tablet, Take 2 tablets (500 mg) on  Day 1,  followed by 1 tablet (250 mg) once daily on Days 2 through 5., Disp: 6 each, Rfl: 0 .  predniSONE (DELTASONE) 10 MG tablet, Take 6 tablets  today, on day 2 take 5 tablets, day 3 take 4 tablets, day 4 take 3 tablets, day 5 take  2 tablets and 1 tablet the last day, Disp: 21 tablet, Rfl: 0  Allergies Patient has no known allergies.  History reviewed. No pertinent family history.  Social History Social History   Tobacco Use  . Smoking status: Current Every Day Smoker    Packs/day: 0.50    Types: Cigarettes  . Smokeless tobacco: Current User  Substance Use Topics  . Alcohol use: Yes     Alcohol/week: 36.0 standard drinks    Types: 36 Cans of beer per week  . Drug use: Yes    Types: Marijuana    Review of Systems Constitutional: No fever ENT: No sore throat. Cardiovascular: Denies chest pain. Respiratory: Denies shortness of breath. Gastrointestinal: No abdominal pain.  No nausea, no vomiting.  No diarrhea. Musculoskeletal: Negative for back pain. Skin: Negative for rash.   ____________________________________________   PHYSICAL EXAM:  VITAL SIGNS: ED Triage Vitals  Enc Vitals Group     BP 02/04/19 1539 133/82     Pulse Rate 02/04/19 1539 93     Resp 02/04/19 1539 16     Temp 02/04/19 1539 98.9 F (37.2 C)     Temp Source 02/04/19 1539 Oral     SpO2 02/04/19 1539 98 %     Weight 02/04/19 1537 220 lb (99.8 kg)     Height 02/04/19 1537 5\' 5"  (1.651 m)     Head Circumference --      Peak Flow --      Pain Score 02/04/19 1536 0     Pain Loc --      Pain Edu? --      Excl. in [place]? --     Constitutional: Alert and oriented. Well appearing and in no acute distress. Eyes: Conjunctivae are normal.  ENT      Head: Normocephalic and atraumatic. Cardiovascular: Normal heart rate. Respiratory: Normal respiratory effort without tachypnea nor retractions.  Musculoskeletal:Steady gait.  Neurologic:  Normal speech and language. Skin:  Skin is warm, dry Psychiatric: Mood and affect are normal. Speech and behavior are normal. Patient exhibits appropriate insight and judgment   ___________________________________________   LABS (all labs ordered are listed, but only abnormal results are displayed)  Labs Reviewed  NOVEL CORONAVIRUS, NAA (HOSP ORDER, SEND-OUT TO REF LAB; TAT 18-24 HRS)    PROCEDURES Procedures    INITIAL IMPRESSION / ASSESSMENT AND PLAN / ED COURSE  Pertinent labs & imaging results that were available during my care of the patient were reviewed by me and considered in my medical decision making (see chart for details).  Well-appearing  patient.  Asymptomatic.  COVID-19 testing completed and advice given.  Monitor.  Discussed follow up and return parameters including no resolution or any worsening concerns. Patient verbalized understanding and agreed to plan.   ____________________________________________   FINAL CLINICAL IMPRESSION(S) / ED DIAGNOSES  Final diagnoses:  Encounter for screening laboratory testing for COVID-19 virus  Exposure to COVID-19 virus     ED Discharge Orders    None       Note: This dictation was prepared with Dragon dictation along with smaller phrase technology. Any transcriptional errors that result from this process are unintentional.         Marylene Land, NP 02/04/19 1702

## 2019-02-05 LAB — NOVEL CORONAVIRUS, NAA (HOSP ORDER, SEND-OUT TO REF LAB; TAT 18-24 HRS): SARS-CoV-2, NAA: NOT DETECTED

## 2019-12-02 ENCOUNTER — Encounter: Payer: Self-pay | Admitting: Emergency Medicine

## 2019-12-02 ENCOUNTER — Emergency Department: Payer: 59

## 2019-12-02 ENCOUNTER — Emergency Department
Admission: EM | Admit: 2019-12-02 | Discharge: 2019-12-02 | Discharge status: Against Medical Advice | Payer: 59 | Attending: Emergency Medicine | Admitting: Emergency Medicine

## 2019-12-02 ENCOUNTER — Other Ambulatory Visit: Payer: Self-pay

## 2019-12-02 DIAGNOSIS — F1590 Other stimulant use, unspecified, uncomplicated: Secondary | ICD-10-CM | POA: Diagnosis not present

## 2019-12-02 DIAGNOSIS — R55 Syncope and collapse: Secondary | ICD-10-CM | POA: Insufficient documentation

## 2019-12-02 DIAGNOSIS — Z7984 Long term (current) use of oral hypoglycemic drugs: Secondary | ICD-10-CM | POA: Insufficient documentation

## 2019-12-02 DIAGNOSIS — E119 Type 2 diabetes mellitus without complications: Secondary | ICD-10-CM | POA: Diagnosis not present

## 2019-12-02 DIAGNOSIS — Y907 Blood alcohol level of 200-239 mg/100 ml: Secondary | ICD-10-CM | POA: Insufficient documentation

## 2019-12-02 DIAGNOSIS — R780 Finding of alcohol in blood: Secondary | ICD-10-CM

## 2019-12-02 DIAGNOSIS — F17210 Nicotine dependence, cigarettes, uncomplicated: Secondary | ICD-10-CM | POA: Diagnosis not present

## 2019-12-02 LAB — CBC WITH DIFFERENTIAL/PLATELET
Abs Immature Granulocytes: 0.04 10*3/uL (ref 0.00–0.07)
Basophils Absolute: 0.1 10*3/uL (ref 0.0–0.1)
Basophils Relative: 1 %
Eosinophils Absolute: 0.1 10*3/uL (ref 0.0–0.5)
Eosinophils Relative: 1 %
HCT: 43.6 % (ref 39.0–52.0)
Hemoglobin: 14.9 g/dL (ref 13.0–17.0)
Immature Granulocytes: 1 %
Lymphocytes Relative: 33 %
Lymphs Abs: 2.8 10*3/uL (ref 0.7–4.0)
MCH: 30.8 pg (ref 26.0–34.0)
MCHC: 34.2 g/dL (ref 30.0–36.0)
MCV: 90.3 fL (ref 80.0–100.0)
Monocytes Absolute: 0.6 10*3/uL (ref 0.1–1.0)
Monocytes Relative: 7 %
Neutro Abs: 5 10*3/uL (ref 1.7–7.7)
Neutrophils Relative %: 57 %
Platelets: 288 10*3/uL (ref 150–400)
RBC: 4.83 MIL/uL (ref 4.22–5.81)
RDW: 12.6 % (ref 11.5–15.5)
WBC: 8.6 10*3/uL (ref 4.0–10.5)
nRBC: 0 % (ref 0.0–0.2)

## 2019-12-02 LAB — COMPREHENSIVE METABOLIC PANEL
ALT: 89 U/L — ABNORMAL HIGH (ref 0–44)
AST: 68 U/L — ABNORMAL HIGH (ref 15–41)
Albumin: 4.4 g/dL (ref 3.5–5.0)
Alkaline Phosphatase: 84 U/L (ref 38–126)
Anion gap: 13 (ref 5–15)
BUN: 6 mg/dL (ref 6–20)
CO2: 23 mmol/L (ref 22–32)
Calcium: 8.7 mg/dL — ABNORMAL LOW (ref 8.9–10.3)
Chloride: 100 mmol/L (ref 98–111)
Creatinine, Ser: 0.75 mg/dL (ref 0.61–1.24)
GFR, Estimated: >60 mL/min (ref >60)
Glucose, Bld: 204 mg/dL — ABNORMAL HIGH (ref 70–99)
Potassium: 3.8 mmol/L (ref 3.5–5.1)
Sodium: 136 mmol/L (ref 135–145)
Total Bilirubin: 0.4 mg/dL (ref 0.3–1.2)
Total Protein: 7.6 g/dL (ref 6.5–8.1)

## 2019-12-02 LAB — TROPONIN I (HIGH SENSITIVITY): Troponin I (High Sensitivity): 11 ng/L (ref <18)

## 2019-12-02 LAB — ETHANOL: Alcohol, Ethyl (B): 239 mg/dL — ABNORMAL HIGH (ref <10)

## 2019-12-02 NOTE — ED Provider Notes (Signed)
[facility] Emergency Department Provider Note  ____________________________________________   First MD Initiated Contact with Patient 12/02/19 1412     (approximate)  I have reviewed the triage vital signs and the nursing notes.   HISTORY  Chief Complaint Loss of Consciousness   HPI Patrick [name] is a 44 y.o. male with a past medical history of diabetes currently on Metformin and daily EtOH use approximately 3 drinks per patient presents via EMS after patient was reportedly found by his wife passed out on the floor next to the bed at a hotel where they are staying.  Patient states he had one beer but denies any other alcohol use or illegal drug use.  He states he "feels fine" just wants to leave.  He states he does not remember how he ended up on the floor denies any recent sick symptoms including headache, vertigo, shortness of breath, cough, abdominal pain, vomiting, diarrhea, dysuria, rash, or other recent episodes of passing out or traumatic injuries.  He does think he had some chest pressure yesterday the day before but none today.  Denies taking blood thinners.  No clear alleviating aggravating precipitating events other than some alcohol earlier today.         Past Medical History:  Diagnosis Date  . Diabetes mellitus without complication ([place])     There are no problems to display for this patient.   History reviewed. No pertinent surgical history.  Prior to Admission medications   Medication Sig Start Date End Date Taking? Authorizing Provider  albuterol (PROVENTIL HFA;VENTOLIN HFA) 108 (90 Base) MCG/ACT inhaler Inhale 2 puffs into the lungs every 6 (six) hours as needed for wheezing or shortness of breath. 05/04/16   Johnn Hai, PA-C  azithromycin (ZITHROMAX Z-PAK) 250 MG tablet Take 2 tablets (500 mg) on  Day 1,  followed by 1 tablet (250 mg) once daily on Days 2 through 5. 05/04/16   Johnn Hai, PA-C  metFORMIN (GLUCOPHAGE) 500  MG tablet Take 1 tablet (500 mg total) by mouth 2 (two) times daily with a meal. 12/01/18 02/04/19  Vanessa [place], MD  predniSONE (DELTASONE) 10 MG tablet Take 6 tablets  today, on day 2 take 5 tablets, day 3 take 4 tablets, day 4 take 3 tablets, day 5 take  2 tablets and 1 tablet the last day 05/04/16   Johnn Hai, PA-C    Allergies Patient has no known allergies.  No family history on file.  Social History Social History   Tobacco Use  . Smoking status: Current Every Day Smoker    Packs/day: 0.50    Types: Cigarettes  . Smokeless tobacco: Current User  Substance Use Topics  . Alcohol use: Yes    Alcohol/week: 36.0 standard drinks    Types: 36 Cans of beer per week  . Drug use: Yes    Types: Marijuana    Review of Systems  Review of Systems  Constitutional: Negative for chills and fever.  HENT: Negative for sore throat.   Eyes: Negative for pain.  Respiratory: Negative for cough and stridor.   Cardiovascular: Positive for chest pain ( 2-3 days ago).  Gastrointestinal: Negative for vomiting.  Genitourinary: Negative for dysuria.  Musculoskeletal: Negative for myalgias.  Skin: Negative for rash.  Neurological: Negative for seizures, loss of consciousness and headaches.  Psychiatric/Behavioral: Negative for suicidal ideas.  All other systems reviewed and are negative.     ____________________________________________   PHYSICAL EXAM:  VITAL SIGNS: ED  Triage Vitals  Enc Vitals Group     BP      Pulse      Resp      Temp      Temp src      SpO2      Weight      Height      Head Circumference      Peak Flow      Pain Score      Pain Loc      Pain Edu?      Excl. in [place]?    Vitals:   12/02/19 1412 12/02/19 1421  BP:  (!) 130/97  Pulse:  85  Resp:  18  Temp:  97.8 F (36.6 C)  SpO2: 100% 99%   Physical Exam Vitals and nursing note reviewed.  Constitutional:      Appearance: He is well-developed.  HENT:     Head: Normocephalic and atraumatic.       Right Ear: External ear normal.     Left Ear: External ear normal.     Nose: Nose normal.  Eyes:     Conjunctiva/sclera: Conjunctivae normal.  Cardiovascular:     Rate and Rhythm: Normal rate and regular rhythm.     Heart sounds: No murmur heard.   Pulmonary:     Effort: Pulmonary effort is normal. No respiratory distress.     Breath sounds: Normal breath sounds.  Abdominal:     Palpations: Abdomen is soft.     Tenderness: There is no abdominal tenderness.  Musculoskeletal:     Cervical back: Neck supple.     Right lower leg: No edema.     Left lower leg: No edema.  Skin:    General: Skin is warm and dry.  Neurological:     Mental Status: He is alert and oriented to person, place, and time.  Psychiatric:        Mood and Affect: Mood normal.        Speech: Speech is slurred.        Thought Content: Thought content does not include homicidal or suicidal ideation.     Cranial nerves II 12 grossly intact.  Patient is symmetric strength of all extremities.  Sensation is intact light touch of all extremities. ____________________________________________   LABS (all labs ordered are listed, but only abnormal results are displayed)  Labs Reviewed  CBC WITH DIFFERENTIAL/PLATELET  COMPREHENSIVE METABOLIC PANEL  ETHANOL  TROPONIN I (HIGH SENSITIVITY)   ____________________________________________   ____________________________________________  RADIOLOGY  ED MD interpretation: No pneumothorax, effusion, edema, or focal consolidation.  Official radiology report(s): DG Chest 1 View  Result Date: 12/02/2019 CLINICAL DATA:  Syncope, smoker EXAM: CHEST  1 VIEW COMPARISON:  05/04/2016 FINDINGS: The heart size and mediastinal contours are within normal limits. Both lungs are clear. The visualized skeletal structures are unremarkable. IMPRESSION: No active disease. Electronically Signed   By: Jerilynn Mages.  Shick M.D.   On: 12/02/2019 14:59     ____________________________________________   PROCEDURES  Procedure(s) performed (including Critical Care):  Procedures   ____________________________________________   INITIAL IMPRESSION / ASSESSMENT AND PLAN / ED COURSE        Patient presents after he was found passed out lying next to the bed after he states he drank 1 beer.  Per EMS his wife was worried that he had passed out although is not clear if he had a witnessed syncopal episode or not.  On arrival his nonfocal neuro exam is slurring speech a  little bit but otherwise is not suicidal homicidal and is stable on room air.  Differential includes but is not limited to syncope secondary to intoxication, dehydration, SAH, symptomatic anemia, metabolic derangements, arrhythmia, ACS.  Low suspicion for PE as patient denies any chest pain or shortness of breath and is not tachycardic hypoxic or tachypneic.  Approximately 1 hour after patient arrival patient for me that patient wished to leave against my advice.  I did reassess the patient and his speech was no longer slurred.  He was able to state the date and was oriented x4.  He also stated he understood that we are very early in the process of working up with possible syncope that he left prior to his EKG lab work and imaging studies resulting that he could have a life-threatening process going on but I would not be able to diagnose could get worse and call patient if he left.  Patient voiced understanding state he still wished to leave against my advice.  Given improvement in slurred speech with patient observed ambulate with steady gait unassisted denying SI or HI I do believe he has capacity make this decision.  Advised patient to return immediately to the emergency room should he change his mind or seek medical attention and evaluation anywhere else he feels comfortable.           ____________________________________________   FINAL CLINICAL IMPRESSION(S) / ED  DIAGNOSES  Final diagnoses:  Syncope and collapse    Medications - No data to display   ED Discharge Orders    None       Note:  This document was prepared using Dragon voice recognition software and may include unintentional dictation errors.   Lucrezia Starch, MD 12/02/19 [phone]

## 2019-12-02 NOTE — ED Triage Notes (Signed)
Patient had syncopal episode and found by wife confused and not oriented. Reported that he was drinking last night.

## 2019-12-02 NOTE — ED Notes (Signed)
Patient to x-ray and CT at this time.

## 2019-12-02 NOTE — ED Notes (Signed)
Patient refusing to stay for further testing. Wife at bedside. MD aware. AMA signed by patient.

## 2021-05-04 IMAGING — CT CT HEAD W/O CM
3 series · 16 of 47 positions shown, 19 images · non-contrast
Comparison: No priors.

CLINICAL DATA: 44-year-old male with history of mental status
change. Syncopal event.

EXAM:
CT HEAD WITHOUT CONTRAST
TECHNIQUE: Contiguous axial images were obtained from the base of the skull
through the vertex without intravenous contrast.

[Series 2: head wo · axial · 0.46mm/px · z∈[-113,+22]mm · 10 of 33 slices shown, 13 images]
[im 3/33  brain]
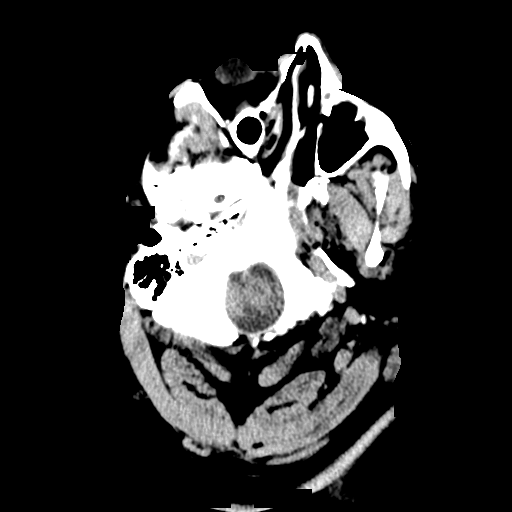
[im 3/33  bone]
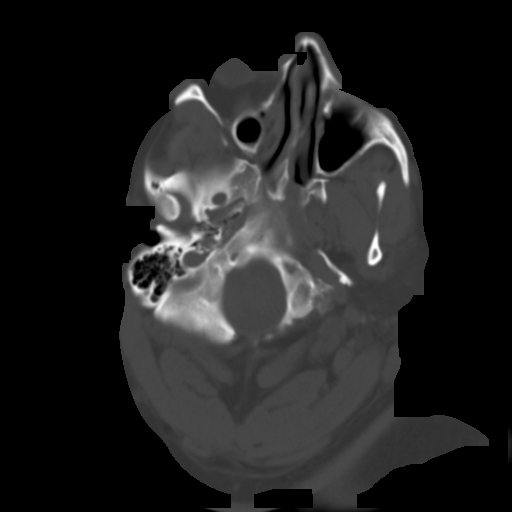
[im 6/33  brain]
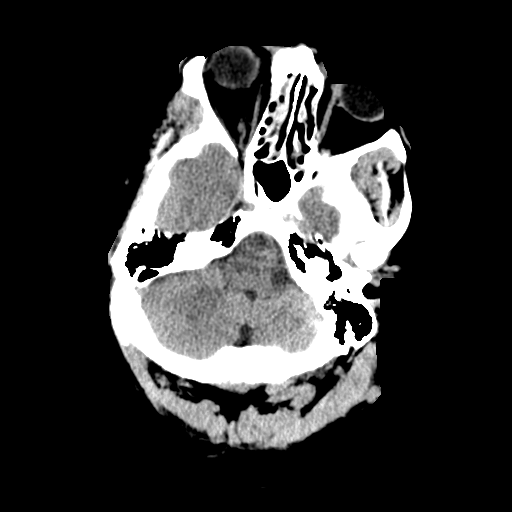
[im 9/33  brain]
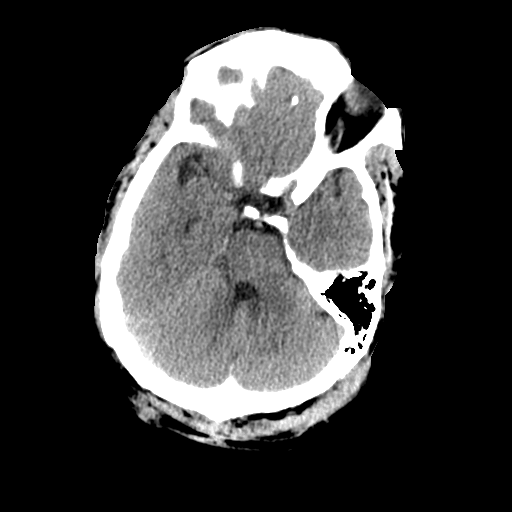
[im 12/33  brain]
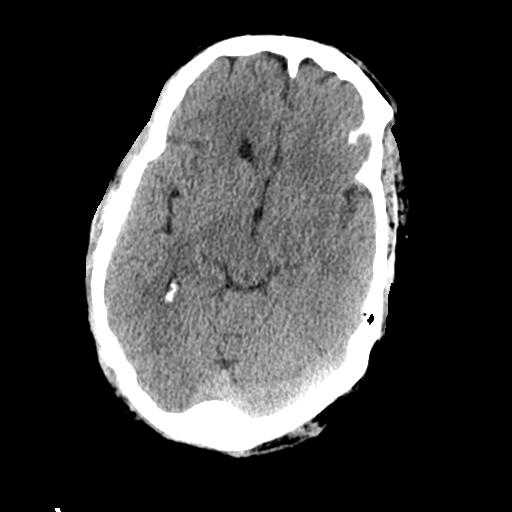
[im 15/33  brain]
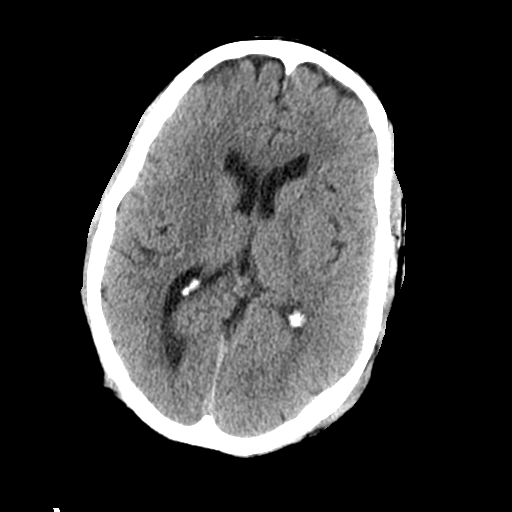
[im 15/33  bone]
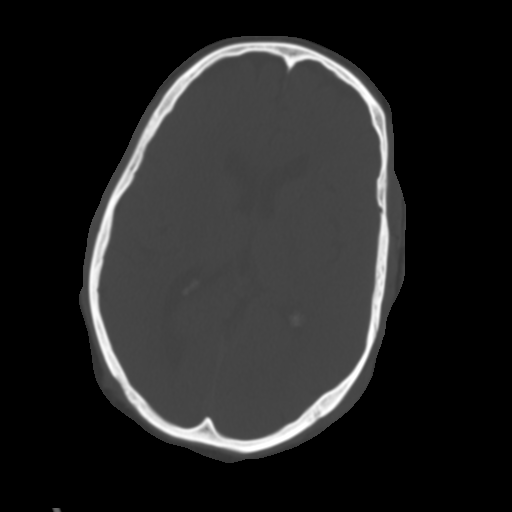
[im 18/33  brain]
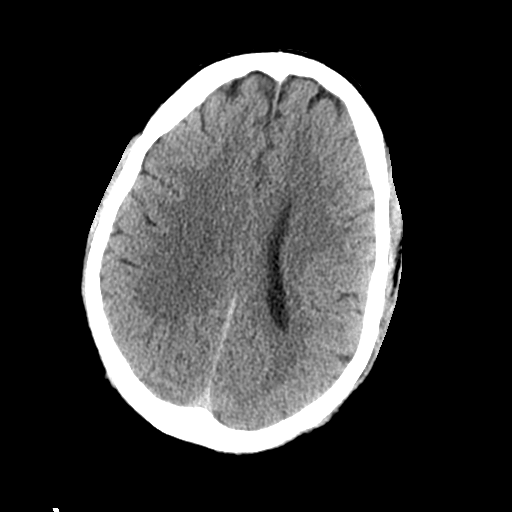
[im 21/33  brain]
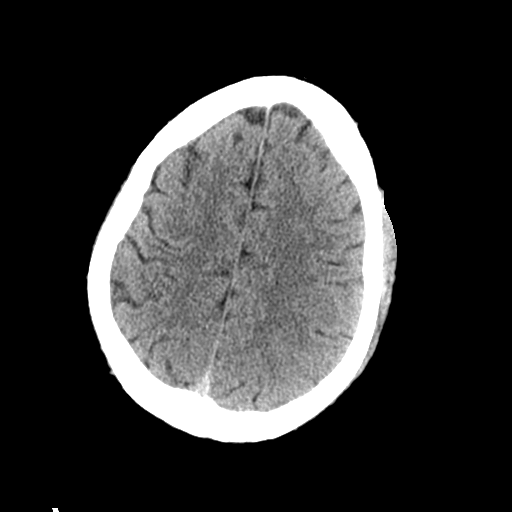
[im 25/33  brain]
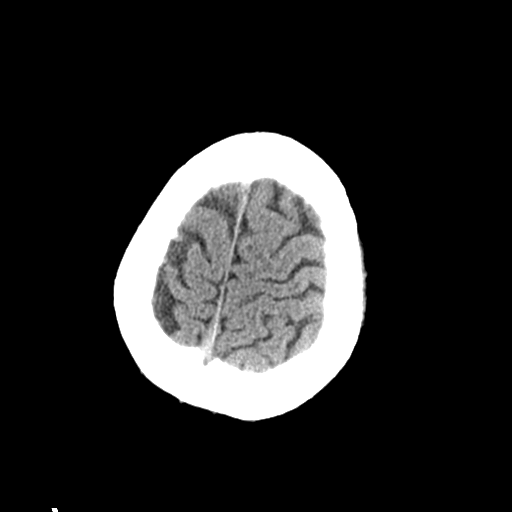
[im 27/33  brain]
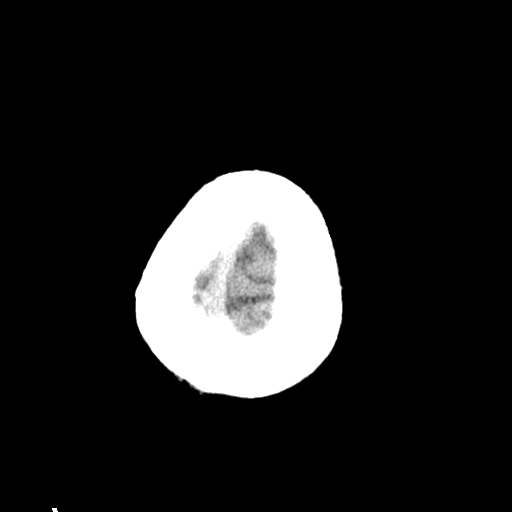
[im 27/33  bone]
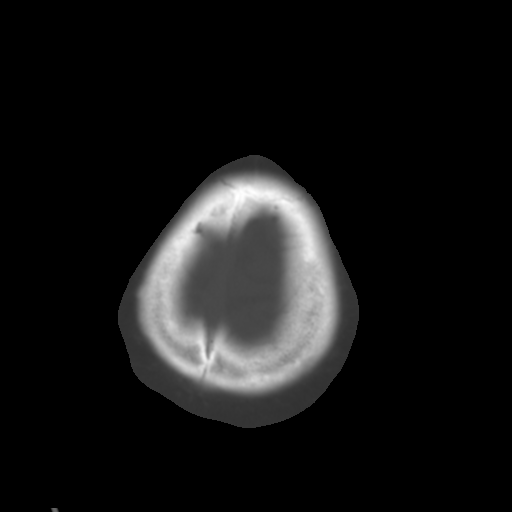
[im 30/33  brain]
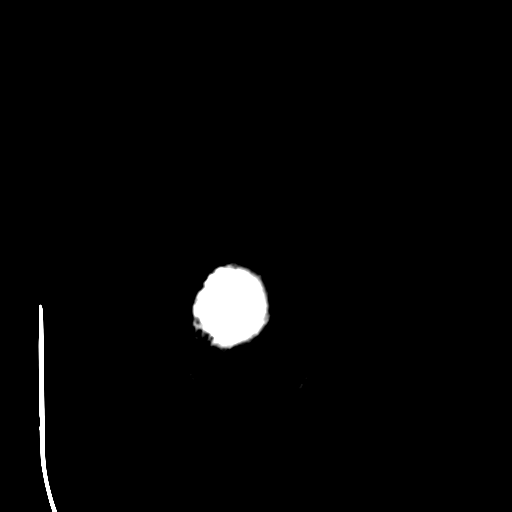

[Series 4: coronal soft tissue · coronal · 0.32mm/px · 3 of 74 slices shown]
[im 25/74  brain]
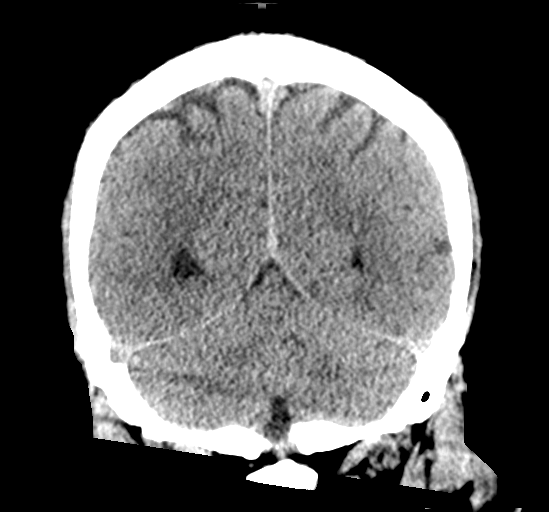
[im 33/74  brain]
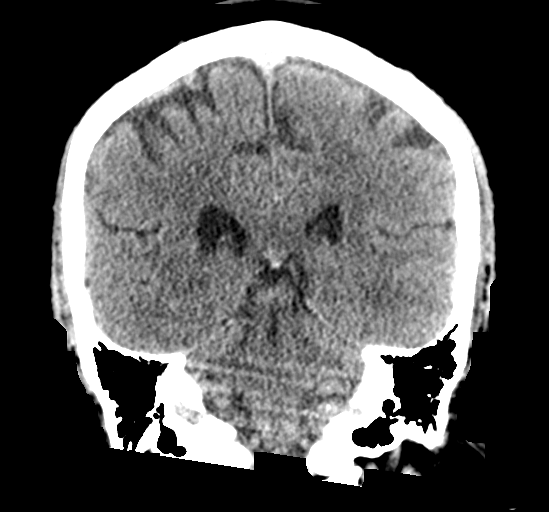
[im 41/74  brain]
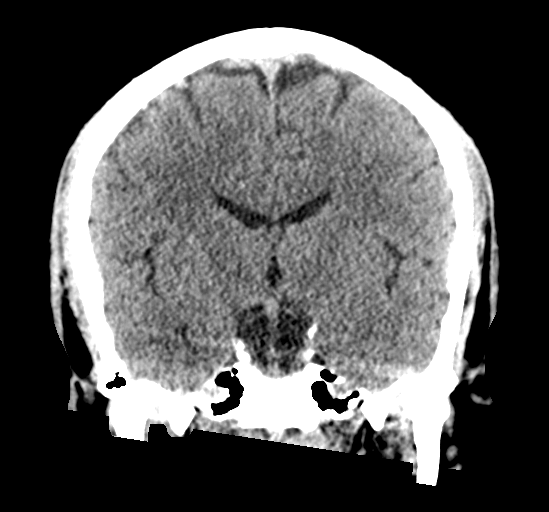

[Series 5: sagittal soft tissue · sagittal · 0.34mm/px · 3 of 60 slices shown]
[im 20/60  brain]
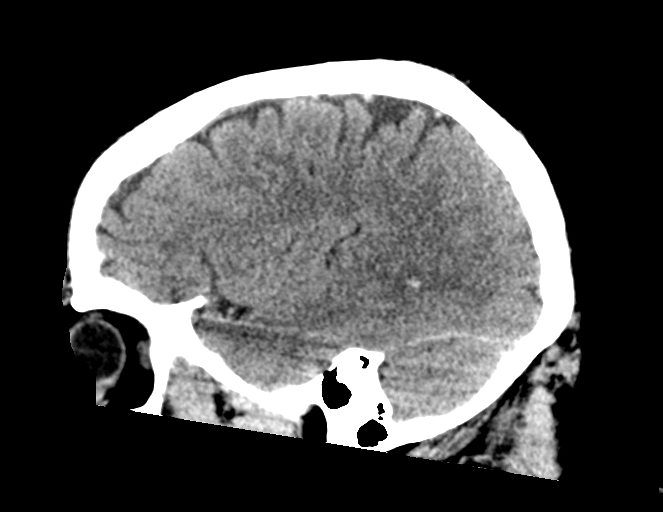
[im 30/60  brain]
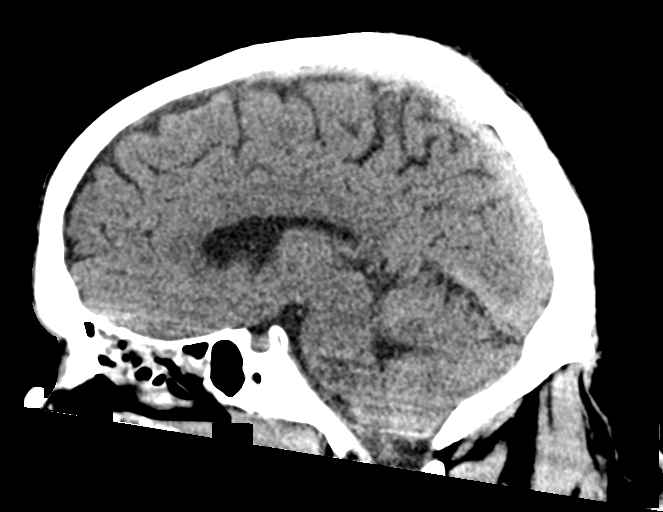
[im 40/60  brain]
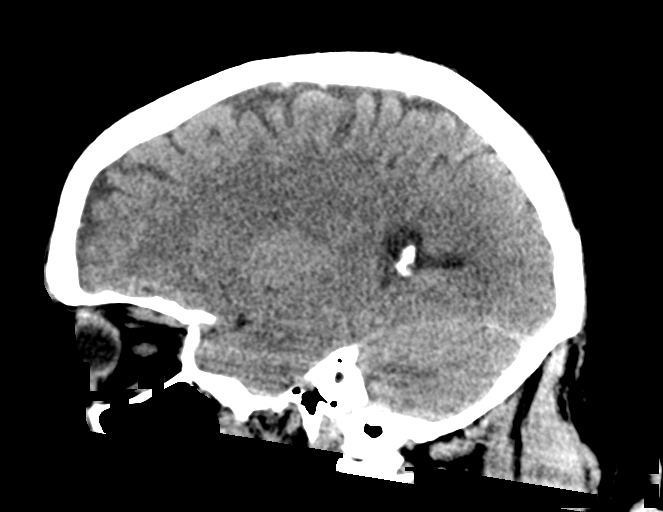

[16 of 47 positions shown; findings below may reference images not displayed]

FINDINGS: Brain: No evidence of acute infarction, hemorrhage, hydrocephalus,
extra-axial collection or mass lesion/mass effect.

Vascular: No hyperdense vessel or unexpected calcification.

Skull: Normal. Negative for fracture or focal lesion.

Sinuses/Orbits: No acute finding.

Other: None.
IMPRESSION: No acute intracranial abnormalities. The appearance of the brain is
normal.

## 2021-06-30 NOTE — Progress Notes (Signed)
New patient visit   Patient: Patrick [name]   DOB: 06-02-1975   45 y.o. Male  MRN: [id number] Visit Date: 07/01/2021  Today's healthcare provider: Mikey Kirschner, PA-C   No chief complaint on file.  Subjective    Patrick [name] is a 46 y.o. male who presents today as a new patient to establish care.  HPI  ***  Past Medical History:  Diagnosis Date  . Diabetes mellitus without complication ([place])    No past surgical history on file. No family status information on file.   No family history on file. Social History   Socioeconomic History  . Marital status: Married    Spouse name: Not on file  . Number of children: Not on file  . Years of education: Not on file  . Highest education level: Not on file  Occupational History  . Not on file  Tobacco Use  . Smoking status: Every Day    Packs/day: 0.50    Types: Cigarettes  . Smokeless tobacco: Current  Substance and Sexual Activity  . Alcohol use: Yes    Alcohol/week: 36.0 standard drinks    Types: 36 Cans of beer per week  . Drug use: Yes    Types: Marijuana  . Sexual activity: Not on file  Other Topics Concern  . Not on file  Social History Narrative  . Not on file   Social Determinants of Health   Financial Resource Strain: Not on file  Food Insecurity: Not on file  Transportation Needs: Not on file  Physical Activity: Not on file  Stress: Not on file  Social Connections: Not on file   Outpatient Medications Prior to Visit  Medication Sig  . albuterol (PROVENTIL HFA;VENTOLIN HFA) 108 (90 Base) MCG/ACT inhaler Inhale 2 puffs into the lungs every 6 (six) hours as needed for wheezing or shortness of breath.  Marland Kitchen azithromycin (ZITHROMAX Z-PAK) 250 MG tablet Take 2 tablets (500 mg) on  Day 1,  followed by 1 tablet (250 mg) once daily on Days 2 through 5.  . metFORMIN (GLUCOPHAGE) 500 MG tablet Take 1 tablet (500 mg total) by mouth 2 (two) times daily with a meal.  . predniSONE (DELTASONE) 10 MG tablet Take 6  tablets  today, on day 2 take 5 tablets, day 3 take 4 tablets, day 4 take 3 tablets, day 5 take  2 tablets and 1 tablet the last day   No facility-administered medications prior to visit.   No Known Allergies   There is no immunization history on file for this patient.  Health Maintenance  Topic Date Due  . COVID-19 Vaccine (1) Never done  . URINE MICROALBUMIN  Never done  . HIV Screening  Never done  . Hepatitis C Screening  Never done  . TETANUS/TDAP  Never done  . COLONOSCOPY (Pts 45-68yr Insurance coverage will need to be confirmed)  Never done  . INFLUENZA VACCINE  08/25/2021  . HPV VACCINES  Aged Out    Patient Care Team: Patient, No Pcp Per (Inactive) as PCP - General (General Practice)  Review of Systems  {Labs  Heme  Chem  Endocrine  Serology  Results Review (optional):23779}   Objective    There were no vitals taken for this visit. {Show previous vital signs (optional):23777}  Physical Exam ***  Depression Screen     View : No data to display.         No results found for any visits on 07/01/21.  Assessment &  Plan     ***  No follow-ups on file.     {provider attestation***:1}   Mikey Kirschner, PA-C  [facility] [phone] (phone) [phone] (fax)  [place]

## 2021-07-01 ENCOUNTER — Encounter: Payer: Self-pay | Admitting: Physician Assistant

## 2021-07-01 ENCOUNTER — Ambulatory Visit: Payer: PRIVATE HEALTH INSURANCE | Admitting: Physician Assistant

## 2021-07-01 ENCOUNTER — Other Ambulatory Visit: Payer: Self-pay

## 2021-07-01 VITALS — BP 125/86 | HR 103 | Ht 65.0 in | Wt 213.6 lb

## 2021-07-01 DIAGNOSIS — E1165 Type 2 diabetes mellitus with hyperglycemia: Secondary | ICD-10-CM | POA: Diagnosis not present

## 2021-07-01 DIAGNOSIS — K429 Umbilical hernia without obstruction or gangrene: Secondary | ICD-10-CM | POA: Diagnosis not present

## 2021-07-01 DIAGNOSIS — Z114 Encounter for screening for human immunodeficiency virus [HIV]: Secondary | ICD-10-CM | POA: Diagnosis not present

## 2021-07-01 DIAGNOSIS — Z1159 Encounter for screening for other viral diseases: Secondary | ICD-10-CM

## 2021-07-01 DIAGNOSIS — Z720 Tobacco use: Secondary | ICD-10-CM | POA: Diagnosis not present

## 2021-07-01 DIAGNOSIS — Z800 Family history of malignant neoplasm of digestive organs: Secondary | ICD-10-CM | POA: Insufficient documentation

## 2021-07-01 DIAGNOSIS — Z1211 Encounter for screening for malignant neoplasm of colon: Secondary | ICD-10-CM

## 2021-07-01 LAB — POCT GLYCOSYLATED HEMOGLOBIN (HGB A1C): Hemoglobin A1C: 9.9 % — AB (ref 4.0–5.6)

## 2021-07-01 MED ORDER — METFORMIN HCL 500 MG PO TABS: ORAL_TABLET | ORAL | 1 refills | Status: DC

## 2021-07-01 MED ORDER — NA SULFATE-K SULFATE-MG SULF 17.5-3.13-1.6 GM/177ML PO SOLN: 1.0000 | Freq: Once | ORAL | 0 refills | Status: AC

## 2021-07-01 MED ORDER — FREESTYLE LIBRE 2 SENSOR MISC: 1.0000 | 3 refills | Status: AC

## 2021-07-01 MED ORDER — GABAPENTIN 100 MG PO CAPS: 100.0000 mg | ORAL_CAPSULE | Freq: Every day | ORAL | 1 refills | Status: DC

## 2021-07-01 NOTE — Assessment & Plan Note (Signed)
Father diagnosed in 75s

## 2021-07-01 NOTE — Progress Notes (Signed)
Gastroenterology Pre-Procedure Review  Request Date:  07/22/2021 Requesting Physician: Dr. Vicente Males  PATIENT REVIEW QUESTIONS: The patient responded to the following health history questions as indicated:    1. Are you having any GI issues? no 2. Do you have a personal history of Polyps? no 3. Do you have a family history of Colon Cancer or Polyps? Yes colon cancer  4. Diabetes Mellitus? yes (typ 2 ) 5. Joint replacements in the past 12 months?no 6. Major health problems in the past 3 months?no 7. Any artificial heart valves, MVP, or defibrillator?no    MEDICATIONS & ALLERGIES:    Patient reports the following regarding taking any anticoagulation/antiplatelet therapy:   Plavix, Coumadin, Eliquis, Xarelto, Lovenox, Pradaxa, Brilinta, or Effient? no Aspirin? no  Patient confirms/reports the following medications:  Current Outpatient Medications  Medication Sig Dispense Refill   Continuous Blood Gluc Sensor (FREESTYLE LIBRE 2 SENSOR) MISC Apply 1 each topically every 14 (fourteen) days. 2 each 3   gabapentin (NEURONTIN) 100 MG capsule Take 1 capsule (100 mg total) by mouth at bedtime. 90 capsule 1   metFORMIN (GLUCOPHAGE) 500 MG tablet Take 500 mg BID with meals x 7 week, increase to 1000 mg in Am and 500 mg in PM x 7 days, and then to 1000 mg in Am and 1000 mg in PM 180 tablet 1   No current facility-administered medications for this visit.    Patient confirms/reports the following allergies:  No Known Allergies  No orders of the defined types were placed in this encounter.   AUTHORIZATION INFORMATION Primary Insurance: 1D#: Group #:  Secondary Insurance: 1D#: Group #:  SCHEDULE INFORMATION: Date: 07/22/2021 Time: Location:[facility]

## 2021-07-01 NOTE — Assessment & Plan Note (Signed)
Advised we would start lung cancer screening at age 46 Encourage to quit precontemplative

## 2021-07-01 NOTE — Assessment & Plan Note (Signed)
Restarted metformin goal is to titrate up to 1000 mg BID w/ meals.  Advised pt that without major lifestyle changes will need additional medication to control blood sugars. Pt has interest in CCM, advised insurance may not cover but will try. Advised in the mean time to check BS fasting AM daily.  Pt unable to urinate, postpone uacr. Will check lipid panel, fasting -- advised we will start statin, but dose depends on cholesterol level. Foot exam completed today Referred to optho. F/u 3 mo

## 2021-07-01 NOTE — Assessment & Plan Note (Signed)
Ref to gen surg d/t size.

## 2021-07-02 ENCOUNTER — Other Ambulatory Visit: Payer: Self-pay | Admitting: Physician Assistant

## 2021-07-02 ENCOUNTER — Telehealth: Payer: Self-pay | Admitting: *Deleted

## 2021-07-02 ENCOUNTER — Other Ambulatory Visit: Payer: Self-pay

## 2021-07-02 DIAGNOSIS — E1165 Type 2 diabetes mellitus with hyperglycemia: Secondary | ICD-10-CM

## 2021-07-02 DIAGNOSIS — E1169 Type 2 diabetes mellitus with other specified complication: Secondary | ICD-10-CM

## 2021-07-02 DIAGNOSIS — R748 Abnormal levels of other serum enzymes: Secondary | ICD-10-CM

## 2021-07-02 LAB — COMPREHENSIVE METABOLIC PANEL
ALT: 73 IU/L — ABNORMAL HIGH (ref 0–44)
AST: 51 IU/L — ABNORMAL HIGH (ref 0–40)
Albumin/Globulin Ratio: 1.9 (ref 1.2–2.2)
Albumin: 5 g/dL (ref 4.0–5.0)
Alkaline Phosphatase: 98 IU/L (ref 44–121)
BUN/Creatinine Ratio: 8 — ABNORMAL LOW (ref 9–20)
BUN: 7 mg/dL (ref 6–24)
Bilirubin Total: 0.3 mg/dL (ref 0.0–1.2)
CO2: 21 mmol/L (ref 20–29)
Calcium: 9.9 mg/dL (ref 8.7–10.2)
Chloride: 98 mmol/L (ref 96–106)
Creatinine, Ser: 0.84 mg/dL (ref 0.76–1.27)
Globulin, Total: 2.7 g/dL (ref 1.5–4.5)
Glucose: 284 mg/dL — ABNORMAL HIGH (ref 70–99)
Potassium: 4.4 mmol/L (ref 3.5–5.2)
Sodium: 136 mmol/L (ref 134–144)
Total Protein: 7.7 g/dL (ref 6.0–8.5)
eGFR: 108 mL/min/{1.73_m2} (ref >59)

## 2021-07-02 LAB — CBC WITH DIFFERENTIAL/PLATELET
Basophils Absolute: 0 10*3/uL (ref 0.0–0.2)
Basos: 0 %
EOS (ABSOLUTE): 0.1 10*3/uL (ref 0.0–0.4)
Eos: 1 %
Hematocrit: 42.7 % (ref 37.5–51.0)
Hemoglobin: 14.3 g/dL (ref 13.0–17.7)
Immature Grans (Abs): 0 10*3/uL (ref 0.0–0.1)
Immature Granulocytes: 1 %
Lymphocytes Absolute: 1.5 10*3/uL (ref 0.7–3.1)
Lymphs: 18 %
MCH: 30.5 pg (ref 26.6–33.0)
MCHC: 33.5 g/dL (ref 31.5–35.7)
MCV: 91 fL (ref 79–97)
Monocytes Absolute: 0.7 10*3/uL (ref 0.1–0.9)
Monocytes: 9 %
Neutrophils Absolute: 6 10*3/uL (ref 1.4–7.0)
Neutrophils: 71 %
Platelets: 250 10*3/uL (ref 150–450)
RBC: 4.69 x10E6/uL (ref 4.14–5.80)
RDW: 12.4 % (ref 11.6–15.4)
WBC: 8.4 10*3/uL (ref 3.4–10.8)

## 2021-07-02 LAB — LIPID PANEL
Chol/HDL Ratio: 4.6 ratio (ref 0.0–5.0)
Cholesterol, Total: 198 mg/dL (ref 100–199)
HDL: 43 mg/dL (ref >39)
LDL Chol Calc (NIH): 117 mg/dL — ABNORMAL HIGH (ref 0–99)
Triglycerides: 218 mg/dL — ABNORMAL HIGH (ref 0–149)
VLDL Cholesterol Cal: 38 mg/dL (ref 5–40)

## 2021-07-02 LAB — HIV ANTIBODY (ROUTINE TESTING W REFLEX): HIV Screen 4th Generation wRfx: NONREACTIVE

## 2021-07-02 LAB — HEPATITIS C ANTIBODY: Hep C Virus Ab: NONREACTIVE

## 2021-07-02 MED ORDER — METFORMIN HCL 500 MG PO TABS: ORAL_TABLET | ORAL | 1 refills | Status: DC

## 2021-07-02 MED ORDER — ATORVASTATIN CALCIUM 20 MG PO TABS: 20.0000 mg | ORAL_TABLET | Freq: Every day | ORAL | 3 refills | Status: DC

## 2021-07-02 NOTE — Telephone Encounter (Signed)
Courtney with [facility] calling for clarification on order for Metformin.  Sig: Take 500 mg BID with meals x 7 week, increase to 1000 mg in Am and 500 mg in PM x 7 days, and then to 1000 mg in Am and 1000 mg in PM  Please review. CB# [phone]

## 2021-07-02 NOTE — Telephone Encounter (Signed)
Corrected

## 2021-07-03 ENCOUNTER — Telehealth: Payer: Self-pay

## 2021-07-03 NOTE — Telephone Encounter (Signed)
Copied from [place] [id number]. Topic: General - Call Back - No Documentation >> Jul 02, 2021  4:38 PM Ja-Kwan M wrote: Reason for CRM: Pt stated he was returning the call from a missed call he received. Cb# [phone]

## 2021-07-09 ENCOUNTER — Ambulatory Visit: Payer: Self-pay | Admitting: Surgery

## 2021-07-21 ENCOUNTER — Encounter: Payer: Self-pay | Admitting: Gastroenterology

## 2021-07-22 ENCOUNTER — Ambulatory Visit: Payer: PRIVATE HEALTH INSURANCE | Admitting: Certified Registered Nurse Anesthetist

## 2021-07-22 ENCOUNTER — Encounter
Admission: RE | Disposition: A | Discharge status: Home / Self Care | Payer: Self-pay | Source: Home / Self Care | Attending: Gastroenterology

## 2021-07-22 ENCOUNTER — Ambulatory Visit
Admission: RE | Admit: 2021-07-22 | Discharge: 2021-07-22 | Disposition: A | Discharge status: Home / Self Care | Payer: PRIVATE HEALTH INSURANCE | Attending: Gastroenterology | Admitting: Gastroenterology

## 2021-07-22 ENCOUNTER — Encounter: Payer: Self-pay | Admitting: Gastroenterology

## 2021-07-22 DIAGNOSIS — D127 Benign neoplasm of rectosigmoid junction: Secondary | ICD-10-CM | POA: Insufficient documentation

## 2021-07-22 DIAGNOSIS — C7A8 Other malignant neuroendocrine tumors: Secondary | ICD-10-CM | POA: Insufficient documentation

## 2021-07-22 DIAGNOSIS — F17210 Nicotine dependence, cigarettes, uncomplicated: Secondary | ICD-10-CM | POA: Diagnosis not present

## 2021-07-22 DIAGNOSIS — Z1211 Encounter for screening for malignant neoplasm of colon: Secondary | ICD-10-CM | POA: Diagnosis present

## 2021-07-22 DIAGNOSIS — D123 Benign neoplasm of transverse colon: Secondary | ICD-10-CM | POA: Diagnosis not present

## 2021-07-22 DIAGNOSIS — D122 Benign neoplasm of ascending colon: Secondary | ICD-10-CM | POA: Diagnosis not present

## 2021-07-22 DIAGNOSIS — Z800 Family history of malignant neoplasm of digestive organs: Secondary | ICD-10-CM | POA: Insufficient documentation

## 2021-07-22 DIAGNOSIS — D126 Benign neoplasm of colon, unspecified: Secondary | ICD-10-CM

## 2021-07-22 DIAGNOSIS — K635 Polyp of colon: Secondary | ICD-10-CM | POA: Diagnosis not present

## 2021-07-22 DIAGNOSIS — Z7984 Long term (current) use of oral hypoglycemic drugs: Secondary | ICD-10-CM | POA: Diagnosis not present

## 2021-07-22 DIAGNOSIS — E119 Type 2 diabetes mellitus without complications: Secondary | ICD-10-CM | POA: Insufficient documentation

## 2021-07-22 DIAGNOSIS — K621 Rectal polyp: Secondary | ICD-10-CM

## 2021-07-22 HISTORY — PX: COLONOSCOPY WITH PROPOFOL: SHX5780

## 2021-07-22 LAB — GLUCOSE, CAPILLARY: Glucose-Capillary: 172 mg/dL — ABNORMAL HIGH (ref 70–99)

## 2021-07-22 SURGERY — COLONOSCOPY WITH PROPOFOL
Anesthesia: General

## 2021-07-22 MED ORDER — PROPOFOL 500 MG/50ML IV EMUL
INTRAVENOUS | Status: DC | PRN
  Administered 2021-07-22: 140 ug/kg/min via INTRAVENOUS

## 2021-07-22 MED ORDER — SODIUM CHLORIDE 0.9 % IV SOLN
INTRAVENOUS | Status: DC
  Administered 2021-07-22: 20 mL/h via INTRAVENOUS

## 2021-07-22 MED ORDER — DEXMEDETOMIDINE (PRECEDEX) IN NS 20 MCG/5ML (4 MCG/ML) IV SYRINGE
PREFILLED_SYRINGE | INTRAVENOUS | Status: DC | PRN
  Administered 2021-07-22: 8 ug via INTRAVENOUS
  Administered 2021-07-22: 12 ug via INTRAVENOUS

## 2021-07-22 MED ORDER — PROPOFOL 10 MG/ML IV BOLUS
INTRAVENOUS | Status: DC | PRN
  Administered 2021-07-22: 80 mg via INTRAVENOUS
  Administered 2021-07-22: 50 mg via INTRAVENOUS
  Administered 2021-07-22: 20 mg via INTRAVENOUS
  Administered 2021-07-22: 50 mg via INTRAVENOUS

## 2021-07-22 MED ORDER — PHENYLEPHRINE 80 MCG/ML (10ML) SYRINGE FOR IV PUSH (FOR BLOOD PRESSURE SUPPORT)
PREFILLED_SYRINGE | INTRAVENOUS | Status: DC | PRN
  Administered 2021-07-22: 80 ug via INTRAVENOUS
  Administered 2021-07-22: 160 ug via INTRAVENOUS
  Administered 2021-07-22 (×2): 80 ug via INTRAVENOUS
  Administered 2021-07-22 (×2): 160 ug via INTRAVENOUS

## 2021-07-22 MED ORDER — SODIUM CHLORIDE 0.9 % IV SOLN
INTRAVENOUS | Status: AC | PRN
  Administered 2021-07-22: 8 mL via INTRAMUSCULAR

## 2021-07-22 MED ORDER — GLYCOPYRROLATE 0.2 MG/ML IJ SOLN
INTRAMUSCULAR | Status: DC | PRN
  Administered 2021-07-22: .2 mg via INTRAVENOUS

## 2021-07-22 MED ORDER — PROPOFOL 1000 MG/100ML IV EMUL
INTRAVENOUS | Status: AC
  Filled 2021-07-22: qty 100

## 2021-07-22 MED ORDER — LIDOCAINE HCL (CARDIAC) PF 100 MG/5ML IV SOSY
PREFILLED_SYRINGE | INTRAVENOUS | Status: DC | PRN
  Administered 2021-07-22: 50 mg via INTRAVENOUS

## 2021-07-22 NOTE — Anesthesia Preprocedure Evaluation (Signed)
Anesthesia Evaluation  Patient identified by MRN, date of birth, ID band Patient awake    Reviewed: Allergy & Precautions, H&P , NPO status , Patient's Chart, lab work & pertinent test results, reviewed documented beta blocker date and time   History of Anesthesia Complications Negative for: history of anesthetic complications  Airway Mallampati: III  TM Distance: >3 FB Neck ROM: full    Dental  (+) Dental Advidsory Given, Teeth Intact, Missing   Pulmonary neg shortness of breath, neg sleep apnea, neg COPD, neg recent URI, Current Smoker and Patient abstained from smoking.,    Pulmonary exam normal breath sounds clear to auscultation       Cardiovascular Exercise Tolerance: Good negative cardio ROS Normal cardiovascular exam Rhythm:regular Rate:Normal     Neuro/Psych negative neurological ROS  negative psych ROS   GI/Hepatic negative GI ROS, Neg liver ROS,   Endo/Other  diabetes  Renal/GU negative Renal ROS  negative genitourinary   Musculoskeletal   Abdominal   Peds  Hematology negative hematology ROS (+)   Anesthesia Other Findings Past Medical History: No date: Diabetes mellitus without complication ([facility])   Reproductive/Obstetrics negative OB ROS                             Anesthesia Physical Anesthesia Plan  ASA: 2  Anesthesia Plan: General   Post-op Pain Management:    Induction: Intravenous  PONV Risk Score and Plan: 1 and Propofol infusion and TIVA  Airway Management Planned: Natural Airway and Nasal Cannula  Additional Equipment:   Intra-op Plan:   Post-operative Plan:   Informed Consent: I have reviewed the patients History and Physical, chart, labs and discussed the procedure including the risks, benefits and alternatives for the proposed anesthesia with the patient or authorized representative who has indicated his/her understanding and acceptance.      Dental Advisory Given  Plan Discussed with: Anesthesiologist, CRNA and Surgeon  Anesthesia Plan Comments:         Anesthesia Quick Evaluation

## 2021-07-22 NOTE — Anesthesia Procedure Notes (Signed)
Date/Time: 07/22/2021 9:08 AM  Performed by: Lily Peer, Jalani Rominger, CRNAPre-anesthesia Checklist: Patient identified, Emergency Drugs available, Suction available, Patient being monitored and Timeout performed Patient Re-evaluated:Patient Re-evaluated prior to induction Oxygen Delivery Method: Nasal cannula Induction Type: IV induction

## 2021-07-22 NOTE — Transfer of Care (Signed)
Immediate Anesthesia Transfer of Care Note  Patient: Patrick [name]  Procedure(s) Performed: COLONOSCOPY WITH PROPOFOL  Patient Location: Endoscopy Unit  Anesthesia Type:General  Level of Consciousness: drowsy  Airway & Oxygen Therapy: Patient Spontanous Breathing  Post-op Assessment: Report given to RN and Post -op Vital signs reviewed and stable  Post vital signs: Reviewed and stable  Last Vitals:  Vitals Value Taken Time  BP 85/58 07/22/21 0948  Temp 36.1 C 07/22/21 0947  Pulse 76 07/22/21 0949  Resp 19 07/22/21 0949  SpO2 95 % 07/22/21 0949  Vitals shown include unvalidated device data.  Last Pain:  Vitals:   07/22/21 0947  TempSrc: Temporal  PainSc: Asleep         Complications: No notable events documented.

## 2021-07-22 NOTE — H&P (Signed)
Jonathon Bellows, MD [address] [place], [place], [place], [address] [address], [place], [place], [place], [address] Phone: [phone]  Fax: [phone]  Primary Care Physician:  Mikey Kirschner, PA-C   Pre-Procedure History & Physical: HPI:  Patrick [name] is a 46 y.o. male is here for an colonoscopy.   Past Medical History:  Diagnosis Date   Diabetes mellitus without complication ([place])     History reviewed. No pertinent surgical history.  Prior to Admission medications   Medication Sig Start Date End Date Taking? Authorizing Provider  atorvastatin (LIPITOR) 20 MG tablet Take 1 tablet (20 mg total) by mouth daily. 07/02/21  Yes Mikey Kirschner, PA-C  Continuous Blood Gluc Sensor (FREESTYLE LIBRE 2 SENSOR) MISC Apply 1 each topically every 14 (fourteen) days. 07/01/21  Yes Mikey Kirschner, PA-C  gabapentin (NEURONTIN) 100 MG capsule Take 1 capsule (100 mg total) by mouth at bedtime. 07/01/21  Yes Drubel, Ria Comment, PA-C  metFORMIN (GLUCOPHAGE) 500 MG tablet Take 500 mg BID with meals x 7 days, increase to 1000 mg in Am and 500 mg in PM x 7 days, and then to 1000 mg in Am and 1000 mg in PM 07/02/21  Yes Drubel, Ria Comment, PA-C    Allergies as of 07/01/2021   (No Known Allergies)    Family History  Problem Relation Age of Onset   Diabetes Mother        109s   Diabetes Father    Diabetes Paternal Aunt     Social History   Socioeconomic History   Marital status: Married    Spouse name: Not on file   Number of children: Not on file   Years of education: Not on file   Highest education level: Not on file  Occupational History   Not on file  Tobacco Use   Smoking status: Every Day    Packs/day: 0.50    Types: Cigarettes   Smokeless tobacco: Current  Vaping Use   Vaping Use: Never used  Substance and Sexual Activity   Alcohol use: Yes    Alcohol/week: 36.0 standard drinks of alcohol    Types: 36 Cans of beer per week   Drug use: Yes    Types: Marijuana   Sexual  activity: Yes  Other Topics Concern   Not on file  Social History Narrative   Not on file   Social Determinants of Health   Financial Resource Strain: Not on file  Food Insecurity: Not on file  Transportation Needs: Not on file  Physical Activity: Not on file  Stress: Not on file  Social Connections: Not on file  Intimate Partner Violence: Not on file    Review of Systems: See HPI, otherwise negative ROS  Physical Exam: BP 121/88   Pulse 88   Temp 97.9 F (36.6 C) (Temporal)   Resp 20   Ht '5\' 5"'$  (1.651 m)   Wt 97.5 kg   SpO2 100%   BMI 35.78 kg/m  General:   Alert,  pleasant and cooperative in NAD Head:  Normocephalic and atraumatic. Neck:  Supple; no masses or thyromegaly. Lungs:  Clear throughout to auscultation, normal respiratory effort.    Heart:  +S1, +S2, Regular rate and rhythm, No edema. Abdomen:  Soft, nontender and nondistended. Normal bowel sounds, without guarding, and without rebound.   Neurologic:  Alert and  oriented x4;  grossly normal neurologically.  Impression/Plan: Patrick [name] is here for an colonoscopy to be performed for Screening colonoscopy ,  father had colon cancer Risks, benefits, limitations, and alternatives regarding  colonoscopy have been reviewed with the patient.  Questions have been answered.  All parties agreeable.   Jonathon Bellows, MD  07/22/2021, 8:56 AM

## 2021-07-22 NOTE — Op Note (Signed)
[facility] Gastroenterology Patient Name: Patrick [name] Procedure Date: 07/22/2021 9:00 AM MRN: [id number] Account #: [id number] Date of Birth: Dec 24, 1975 Admit Type: Outpatient Age: 46 Room: [facility] ENDO ROOM 3 Gender: Male Note Status: Finalized Instrument Name: Jasper Riling [id number] Procedure:             Colonoscopy Indications:           Colon cancer screening in patient at increased risk:                         Colorectal cancer in father Providers:             Jonathon Bellows MD, MD Medicines:             Monitored Anesthesia Care Complications:         No immediate complications. Procedure:             Pre-Anesthesia Assessment:                        - Prior to the procedure, a History and Physical was                         performed, and patient medications, allergies and                         sensitivities were reviewed. The patient's tolerance                         of previous anesthesia was reviewed.                        - The risks and benefits of the procedure and the                         sedation options and risks were discussed with the                         patient. All questions were answered and informed                         consent was obtained.                        - ASA Grade Assessment: II - A patient with mild                         systemic disease.                        After obtaining informed consent, the colonoscope was                         passed under direct vision. Throughout the procedure,                         the patient's blood pressure, pulse, and oxygen                         saturations were monitored continuously. The  Colonoscope was introduced through the anus and                         advanced to the the cecum, identified by the                         appendiceal orifice. The colonoscopy was unusually                         difficult due to the patient's agitation. The patient                          tolerated the procedure well. The quality of the bowel                         preparation was fair. Findings:      The perianal and digital rectal examinations were normal.      Ten sessile polyps were found in the recto-sigmoid colon. The polyps       were 5 to 8 mm in size. These polyps were removed with a cold snare.       Resection and retrieval were complete.      A 12 mm polyp was found in the recto-sigmoid colon. The polyp was       sessile. The polyp was removed with a hot snare. Resection and retrieval       were complete.      A 20 mm polyp was found in the rectum. The polyp was sessile.       Preparations were made for mucosal resection. Saline was injected to       raise the lesion. Snare mucosal resection was performed. Resection and       retrieval were complete. To prevent bleeding after mucosal resection,       two hemostatic clips were successfully placed. There was no bleeding       during, or at the end, of the procedure. borders of polyp marked using       blue light or nbi      A 15 mm polyp was found in the transverse colon. The polyp was sessile.       The polyp was removed with a hot snare. Resection and retrieval were       complete. To prevent bleeding after the polypectomy, one hemostatic clip       was successfully placed. There was no bleeding during, or at the end, of       the procedure.      A 5 mm polyp was found in the ascending colon. The polyp was sessile.       The polyp was removed with a cold snare. Resection and retrieval were       complete.      The exam was otherwise without abnormality on direct and retroflexion       views. Impression:            - Preparation of the colon was fair.                        - Ten 5 to 8 mm polyps at the recto-sigmoid colon,  removed with a cold snare. Resected and retrieved.                        - One 12 mm polyp at the recto-sigmoid colon, removed                          with a hot snare. Resected and retrieved.                        - One 20 mm polyp in the rectum, removed with mucosal                         resection. Resected and retrieved. Clips were placed.                        - One 15 mm polyp in the transverse colon, removed                         with a hot snare. Resected and retrieved. Clip was                         placed.                        - One 5 mm polyp in the ascending colon, removed with                         a cold snare. Resected and retrieved.                        - The examination was otherwise normal on direct and                         retroflexion views.                        - Mucosal resection was performed. Resection and                         retrieval were complete. Recommendation:        - Discharge patient to home (with escort).                        - Resume previous diet.                        - Continue present medications.                        - Await pathology results.                        - Repeat colonoscopy in 6 months because the bowel                         preparation was suboptimal. Procedure Code(s):     --- Professional ---                        [phone], Colonoscopy, flexible; with endoscopic  mucosal                         resection                        45385, 59, Colonoscopy, flexible; with removal of                         tumor(s), polyp(s), or other lesion(s) by snare                         technique Diagnosis Code(s):     --- Professional ---                        Z80.0, Family history of malignant neoplasm of                         digestive organs                        K63.5, Polyp of colon                        K62.1, Rectal polyp CPT copyright 2019 American Medical Association. All rights reserved. The codes documented in this report are preliminary and upon coder review may  be revised to meet current compliance requirements. Jonathon Bellows, MD Jonathon Bellows MD, MD 07/22/2021 9:46:44 AM This report has been signed electronically. Number of Addenda: 0 Note Initiated On: 07/22/2021 9:00 AM Scope Withdrawal Time: 0 hours 21 minutes 27 seconds  Total Procedure Duration: 0 hours 33 minutes 57 seconds  Estimated Blood Loss:  Estimated blood loss: none.      [facility]

## 2021-07-23 ENCOUNTER — Ambulatory Visit: Payer: Self-pay | Admitting: Surgery

## 2021-07-23 ENCOUNTER — Encounter: Payer: Self-pay | Admitting: Gastroenterology

## 2021-07-24 LAB — SURGICAL PATHOLOGY

## 2021-07-28 NOTE — Anesthesia Postprocedure Evaluation (Signed)
Anesthesia Post Note  Patient: Patrick [name]  Procedure(s) Performed: COLONOSCOPY WITH PROPOFOL  Patient location during evaluation: Endoscopy Anesthesia Type: General Level of consciousness: awake and alert Pain management: pain level controlled Vital Signs Assessment: post-procedure vital signs reviewed and stable Respiratory status: spontaneous breathing, nonlabored ventilation, respiratory function stable and patient connected to nasal cannula oxygen Cardiovascular status: blood pressure returned to baseline and stable Postop Assessment: no apparent nausea or vomiting Anesthetic complications: no   No notable events documented.   Last Vitals:  Vitals:   07/22/21 1007 07/22/21 1017  BP: 118/81 133/85  Pulse:    Resp:    Temp:    SpO2:      Last Pain:  Vitals:   07/22/21 1017  TempSrc:   PainSc: 0-No pain                 Martha Clan

## 2021-08-05 NOTE — Progress Notes (Signed)
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

## 2021-08-10 ENCOUNTER — Telehealth: Payer: Self-pay

## 2021-08-10 DIAGNOSIS — D3A8 Other benign neuroendocrine tumors: Secondary | ICD-10-CM

## 2021-08-10 NOTE — Telephone Encounter (Signed)
-----   Message from Jonathon Bellows, MD sent at 08/05/2021  8:33 AM EDT ----- Inform polyp in rectum was slow growing tumor - took it out but pathology says tumor extends to edge of specimen- I want to do a flex sig and tatoo the area-please schedule

## 2021-08-10 NOTE — Telephone Encounter (Signed)
Called and left a message for call back. Sent mychart message also

## 2021-08-11 NOTE — Telephone Encounter (Signed)
Called and left a message for call back. Patient read mychart message  

## 2021-08-17 NOTE — Progress Notes (Signed)
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

## 2021-08-19 ENCOUNTER — Telehealth: Payer: Self-pay | Admitting: Physician Assistant

## 2021-08-19 NOTE — Telephone Encounter (Signed)
Called patient and left him a voicemail to call us back.

## 2021-08-19 NOTE — Progress Notes (Signed)
Can we send him a letter in the mail as well please to contact us asap

## 2021-08-19 NOTE — Telephone Encounter (Signed)
A letter was sent to give [place] a call so we could schedule him a flexible sigmoidoscopy. He was also referred to be seen at the [facility].

## 2021-08-19 NOTE — Addendum Note (Signed)
Addended by: Wayna Chalet on: 08/19/2021 03:20 PM   Modules accepted: Orders

## 2021-08-19 NOTE — Telephone Encounter (Signed)
Left message pt needs to call us or GI back regarding colonoscopy results

## 2021-08-26 ENCOUNTER — Inpatient Hospital Stay: Payer: PRIVATE HEALTH INSURANCE | Admitting: Oncology

## 2021-08-26 ENCOUNTER — Inpatient Hospital Stay: Payer: PRIVATE HEALTH INSURANCE

## 2021-12-29 ENCOUNTER — Emergency Department
Admission: EM | Admit: 2021-12-29 | Discharge: 2021-12-29 | Disposition: A | Discharge status: Home / Self Care | Payer: PRIVATE HEALTH INSURANCE

## 2021-12-29 NOTE — ED Notes (Signed)
Patient called x3 for triage with no answer from lobby.

## 2022-08-05 ENCOUNTER — Ambulatory Visit: Payer: PRIVATE HEALTH INSURANCE | Admitting: Physician Assistant

## 2023-03-31 ENCOUNTER — Ambulatory Visit: Payer: Medicaid Other | Admitting: Family Medicine

## 2023-06-08 ENCOUNTER — Ambulatory Visit (INDEPENDENT_AMBULATORY_CARE_PROVIDER_SITE_OTHER): Admitting: Physician Assistant

## 2023-06-08 VITALS — BP 136/77 | HR 123 | Resp 16 | Ht 65.0 in | Wt 207.0 lb

## 2023-06-08 DIAGNOSIS — Z720 Tobacco use: Secondary | ICD-10-CM

## 2023-06-08 DIAGNOSIS — K429 Umbilical hernia without obstruction or gangrene: Secondary | ICD-10-CM

## 2023-06-08 DIAGNOSIS — E1169 Type 2 diabetes mellitus with other specified complication: Secondary | ICD-10-CM

## 2023-06-08 DIAGNOSIS — E1165 Type 2 diabetes mellitus with hyperglycemia: Secondary | ICD-10-CM

## 2023-06-08 DIAGNOSIS — R933 Abnormal findings on diagnostic imaging of other parts of digestive tract: Secondary | ICD-10-CM

## 2023-06-08 DIAGNOSIS — R748 Abnormal levels of other serum enzymes: Secondary | ICD-10-CM

## 2023-06-08 DIAGNOSIS — I499 Cardiac arrhythmia, unspecified: Secondary | ICD-10-CM | POA: Diagnosis not present

## 2023-06-08 DIAGNOSIS — E785 Hyperlipidemia, unspecified: Secondary | ICD-10-CM

## 2023-06-08 NOTE — Progress Notes (Signed)
 New patient visit  Patient: Patrick [name]   DOB: [birth year]   48 y.o. Male  MRN: [id number] Visit Date: 06/08/2023  Today's healthcare provider: Blane Bunting, PA-C   Chief Complaint  Patient presents with   New Patient (Initial Visit)    NP/ foot problems( feels he is walking on rock with shocking pain)  and possible Anal Tum..Want to discuss wt loss meds( Ozempic).. Also a referral to Endo    Subjective    Patrick [name] is a 48 y.o. male who presents today as a new patient to establish care.   Discussed the use of AI scribe software for clinical note transcription with the patient, who gave verbal consent to proceed.  History of Present Illness Patrick [name] is a 48 year old male who presents with concerns about heart rate and potential cardiac issues.  He notes elevated vitals during the visit but denies syncope, palpitations, chest pain, dyspnea, or tachycardia. He has a significant smoking history, consuming about a pack per day for the past twenty years. He drinks one cup of coffee every morning. He denies current depression, anxiety, changes in vision, speech, or weakness.     07/01/2021    1:58 PM  PHQ9 SCORE ONLY  PHQ-9 Total Score 0     Past Medical History:  Diagnosis Date   Diabetes mellitus without complication [facility])    Past Surgical History:  Procedure Laterality Date   COLONOSCOPY WITH PROPOFOL  N/A 07/22/2021   Procedure: COLONOSCOPY WITH PROPOFOL ;  Surgeon: Luke Salaam, MD;  Location: [facility] ENDOSCOPY;  Service: Gastroenterology;  Laterality: N/A;   Family Status  Relation Name Status   Mother  Alive   Father  Deceased   Deatra Face Aunt  Deceased  No partnership data on file   Family History  Problem Relation Age of Onset   Diabetes Mother        21s   Diabetes Father    Diabetes Paternal Aunt    Social History   Socioeconomic History   Marital status: Married    Spouse name: Not on file   Number of children: Not on file   Years of education: Not  on file   Highest education level: Not on file  Occupational History   Not on file  Tobacco Use   Smoking status: Every Day    Current packs/day: 0.50    Types: Cigarettes   Smokeless tobacco: Current  Vaping Use   Vaping status: Never Used  Substance and Sexual Activity   Alcohol use: Yes    Alcohol/week: 36.0 standard drinks of alcohol    Types: 36 Cans of beer per week   Drug use: Yes    Types: Marijuana   Sexual activity: Yes  Other Topics Concern   Not on file  Social History Narrative   Not on file   Social Drivers of Health   Financial Resource Strain: Not on file  Food Insecurity: Not on file  Transportation Needs: Not on file  Physical Activity: Not on file  Stress: Not on file  Social Connections: Not on file   Outpatient Medications Prior to Visit  Medication Sig   atorvastatin  (LIPITOR) 20 MG tablet Take 1 tablet (20 mg total) by mouth daily.   Continuous Blood Gluc Sensor (FREESTYLE LIBRE 2 SENSOR) MISC Apply 1 each topically every 14 (fourteen) days.   metFORMIN  (GLUCOPHAGE ) 500 MG tablet Take 500 mg BID with meals x 7 days, increase to 1000 mg in Am and 500 mg  in PM x 7 days, and then to 1000 mg in Am and 1000 mg in PM   gabapentin  (NEURONTIN ) 100 MG capsule Take 1 capsule (100 mg total) by mouth at bedtime. (Patient not taking: Reported on 06/08/2023)   No facility-administered medications prior to visit.   No Known Allergies  Immunization History  Administered Date(s) Administered   Hepatitis A 12/15/2017   PFIZER Comirnaty(Gray Top)Covid-19 Tri-Sucrose Vaccine 10/10/2019, 11/18/2019    Health Maintenance  Topic Date Due   OPHTHALMOLOGY EXAM  Never done   Diabetic kidney evaluation - Urine ACR  Never done   DTaP/Tdap/Td (1 - Tdap) Never done   Pneumococcal Vaccine 17-73 Years old (1 of 2 - PCV) Never done   COVID-19 Vaccine (3 - Pfizer risk series) 12/16/2019   HEMOGLOBIN A1C  12/31/2021   Diabetic kidney evaluation - eGFR measurement   07/02/2022   FOOT EXAM  07/02/2022   INFLUENZA VACCINE  08/26/2023   Colonoscopy  07/23/2031   Hepatitis C Screening  Completed   HIV Screening  Completed   HPV VACCINES  Aged Out   Meningococcal B Vaccine  Aged Out    Patient Care Team: Oseas Detty, PA-C as PCP - [name] (Physician Assistant)  Review of Systems  All other systems reviewed and are negative.  Except see HPI       Objective    BP 136/77 (BP Location: Right Arm, Patient Position: Sitting)   Pulse (!) 123   Resp 16   Ht 5\' 5"  (1.651 m)   Wt 207 lb (93.9 kg)   SpO2 99%   BMI 34.45 kg/m     Physical Exam Vitals reviewed.  Constitutional:      [name]: He is not in acute distress.    Appearance: Normal appearance. He is not diaphoretic.  HENT:     Head: Normocephalic and atraumatic.  Eyes:     [name]: No scleral icterus.    Conjunctiva/sclera: Conjunctivae normal.  Cardiovascular:     Rate and Rhythm: Normal rate and regular rhythm.     Pulses: Normal pulses.     Heart sounds: Normal heart sounds. No murmur heard. Pulmonary:     Effort: Pulmonary effort is normal. No respiratory distress.     Breath sounds: Normal breath sounds. No wheezing or rhonchi.  Musculoskeletal:     Cervical back: Neck supple.     Right lower leg: No edema.     Left lower leg: No edema.  Lymphadenopathy:     Cervical: No cervical adenopathy.  Skin:    [name]: Skin is warm and dry.     Findings: No rash.  Neurological:     Mental Status: He is alert and oriented to person, place, and time. Mental status is at baseline.  Psychiatric:        Mood and Affect: Mood normal.        Behavior: Behavior normal.    Depression Screen    07/01/2021    1:58 PM  PHQ 2/9 Scores  PHQ - 2 Score 0  PHQ- 9 Score 0   No results found for any visits on 06/08/23.  Assessment & Plan      Assessment & Plan Unspecified heart rhythm changes EKG showed normal rhythm with unspecified changes. Potential underlying issues include  uncontrolled diabetes or cardiovascular conditions. Advised monitoring for symptoms requiring emergency evaluation. - Order blood work to evaluate underlying causes. - Monitor blood pressure and heart rate daily for two weeks. - Advise emergency care for  symptoms like chest pain or shortness of breath. - Schedule follow-up in two weeks.  Diabetes mellitus, unspecified Chronic Concern for uncontrolled diabetes contributing to heart rhythm changes and possible neuropathy. - Order blood work to assess diabetes control. Will reassess after lab result swill be back  Tobacco use Smokes one pack per day for twenty years. Smoking is a cardiovascular risk factor and may affect heart rhythm.  Encounter to establish care Welcomed to our clinic Reviewed past medical hx, social hx, family hx and surgical hx Pt advised to send all vaccination records or screening  Type 2 diabetes mellitus with hyperglycemia, without long-term current use of insulin (HCC) (Primary)  - Ambulatory referral to Gastroenterology - Lipid panel - Hemoglobin A1c - TSH - Comprehensive metabolic panel with GFR - CBC with Differential/Platelet - Microalbumin / creatinine urine ratio  Umbilical hernia without obstruction and without gangrene Chronic Stable Will monitor for sings of obstruction dn inflammation  Hyperlipidemia associated with type 2 diabetes mellitus (HCC) Chronic Advised to continue taking atorvastatin  20mg  - Ambulatory referral to Gastroenterology - Lipid panel Continue low cholesterol diet and exercise Will follow-up  Elevated liver enzymes In the past, will need to recheck - Comprehensive metabolic panel with GFR  Irregular heartbeat Tachycardia - EKG 12-Lead   No follow-ups on file.    The patient was advised to call back or seek an in-person evaluation if the symptoms worsen or if the condition fails to improve as anticipated.  I discussed the assessment and treatment plan with the  patient. The patient was provided an opportunity to ask questions and all were answered. The patient agreed with the plan and demonstrated an understanding of the instructions.  I, Priest Lockridge, PA-C have reviewed all documentation for this visit. The documentation on  06/08/2023   for the exam, diagnosis, procedures, and orders are all accurate and complete.  Blane Bunting, [facility], MMS [facility] [phone] (phone) [phone] (fax)  [facility]

## 2023-06-09 LAB — COMPREHENSIVE METABOLIC PANEL WITH GFR
ALT: 59 IU/L — ABNORMAL HIGH (ref 0–44)
AST: 44 IU/L — ABNORMAL HIGH (ref 0–40)
Albumin: 4.9 g/dL (ref 4.1–5.1)
Alkaline Phosphatase: 111 IU/L (ref 44–121)
BUN/Creatinine Ratio: 13 (ref 9–20)
BUN: 12 mg/dL (ref 6–24)
Bilirubin Total: 0.4 mg/dL (ref 0.0–1.2)
CO2: 22 mmol/L (ref 20–29)
Calcium: 10.4 mg/dL — ABNORMAL HIGH (ref 8.7–10.2)
Chloride: 99 mmol/L (ref 96–106)
Creatinine, Ser: 0.94 mg/dL (ref 0.76–1.27)
Globulin, Total: 3.1 g/dL (ref 1.5–4.5)
Glucose: 201 mg/dL — ABNORMAL HIGH (ref 70–99)
Potassium: 5.3 mmol/L — ABNORMAL HIGH (ref 3.5–5.2)
Sodium: 137 mmol/L (ref 134–144)
Total Protein: 8 g/dL (ref 6.0–8.5)
eGFR: 99 mL/min/{1.73_m2} (ref >59)

## 2023-06-09 LAB — CBC WITH DIFFERENTIAL/PLATELET
Basophils Absolute: 0 10*3/uL (ref 0.0–0.2)
Basos: 0 %
EOS (ABSOLUTE): 0.1 10*3/uL (ref 0.0–0.4)
Eos: 1 %
Hematocrit: 45.5 % (ref 37.5–51.0)
Hemoglobin: 15 g/dL (ref 13.0–17.7)
Immature Grans (Abs): 0 10*3/uL (ref 0.0–0.1)
Immature Granulocytes: 0 %
Lymphocytes Absolute: 3 10*3/uL (ref 0.7–3.1)
Lymphs: 28 %
MCH: 31 pg (ref 26.6–33.0)
MCHC: 33 g/dL (ref 31.5–35.7)
MCV: 94 fL (ref 79–97)
Monocytes Absolute: 1 10*3/uL — ABNORMAL HIGH (ref 0.1–0.9)
Monocytes: 9 %
Neutrophils Absolute: 6.5 10*3/uL (ref 1.4–7.0)
Neutrophils: 62 %
Platelets: 266 10*3/uL (ref 150–450)
RBC: 4.84 x10E6/uL (ref 4.14–5.80)
RDW: 12.5 % (ref 11.6–15.4)
WBC: 10.6 10*3/uL (ref 3.4–10.8)

## 2023-06-09 LAB — MICROALBUMIN / CREATININE URINE RATIO
Creatinine, Urine: 147.1 mg/dL
Microalb/Creat Ratio: 126 mg/g{creat} — ABNORMAL HIGH (ref 0–29)
Microalbumin, Urine: 184.8 ug/mL

## 2023-06-09 LAB — LIPID PANEL
Chol/HDL Ratio: 3.7 ratio (ref 0.0–5.0)
Cholesterol, Total: 215 mg/dL — ABNORMAL HIGH (ref 100–199)
HDL: 58 mg/dL (ref >39)
LDL Chol Calc (NIH): 139 mg/dL — ABNORMAL HIGH (ref 0–99)
Triglycerides: 103 mg/dL (ref 0–149)
VLDL Cholesterol Cal: 18 mg/dL (ref 5–40)

## 2023-06-09 LAB — HEMOGLOBIN A1C
Est. average glucose Bld gHb Est-mCnc: 237 mg/dL
Hgb A1c MFr Bld: 9.9 % — ABNORMAL HIGH (ref 4.8–5.6)

## 2023-06-09 LAB — TSH: TSH: 1.5 u[IU]/mL (ref 0.450–4.500)

## 2023-06-12 ENCOUNTER — Encounter: Payer: Self-pay | Admitting: Physician Assistant

## 2023-06-14 ENCOUNTER — Ambulatory Visit: Payer: Self-pay | Admitting: Physician Assistant

## 2023-06-22 ENCOUNTER — Ambulatory Visit (INDEPENDENT_AMBULATORY_CARE_PROVIDER_SITE_OTHER): Admitting: Physician Assistant

## 2023-06-22 ENCOUNTER — Other Ambulatory Visit: Payer: Self-pay

## 2023-06-22 ENCOUNTER — Encounter: Payer: Self-pay | Admitting: Physician Assistant

## 2023-06-22 VITALS — BP 138/81 | HR 77 | Resp 16 | Ht 65.0 in | Wt 207.0 lb

## 2023-06-22 DIAGNOSIS — E785 Hyperlipidemia, unspecified: Secondary | ICD-10-CM

## 2023-06-22 DIAGNOSIS — E119 Type 2 diabetes mellitus without complications: Secondary | ICD-10-CM

## 2023-06-22 DIAGNOSIS — E1169 Type 2 diabetes mellitus with other specified complication: Secondary | ICD-10-CM | POA: Diagnosis not present

## 2023-06-22 DIAGNOSIS — E1165 Type 2 diabetes mellitus with hyperglycemia: Secondary | ICD-10-CM

## 2023-06-22 DIAGNOSIS — Z1211 Encounter for screening for malignant neoplasm of colon: Secondary | ICD-10-CM

## 2023-06-22 DIAGNOSIS — R748 Abnormal levels of other serum enzymes: Secondary | ICD-10-CM

## 2023-06-22 DIAGNOSIS — I499 Cardiac arrhythmia, unspecified: Secondary | ICD-10-CM

## 2023-06-22 DIAGNOSIS — Z720 Tobacco use: Secondary | ICD-10-CM | POA: Diagnosis not present

## 2023-06-22 DIAGNOSIS — G629 Polyneuropathy, unspecified: Secondary | ICD-10-CM

## 2023-06-22 DIAGNOSIS — F1090 Alcohol use, unspecified, uncomplicated: Secondary | ICD-10-CM

## 2023-06-22 MED ORDER — METFORMIN HCL 500 MG PO TABS
ORAL_TABLET | ORAL | 1 refills | Status: AC
Start: 2023-06-22 — End: ?

## 2023-06-22 MED ORDER — GABAPENTIN 100 MG PO CAPS: 100.0000 mg | ORAL_CAPSULE | Freq: Every day | ORAL | 1 refills | Status: DC

## 2023-06-22 MED ORDER — GABAPENTIN 100 MG PO CAPS: 100.0000 mg | ORAL_CAPSULE | Freq: Three times a day (TID) | ORAL | 1 refills | Status: DC

## 2023-06-22 MED ORDER — ATORVASTATIN CALCIUM 20 MG PO TABS: 20.0000 mg | ORAL_TABLET | Freq: Every day | ORAL | 3 refills | Status: AC

## 2023-06-22 NOTE — Progress Notes (Signed)
 Established patient visit  Patient: Patrick [name]   DOB: 21-Jan-1976   48 y.o. Male  MRN: [id number] Visit Date: 06/22/2023  Today's healthcare provider: Blane Bunting, PA-C   Chief Complaint  Patient presents with   Follow-up    F/u on DM pt checks bs @ night some different reading.   Subjective     HPI     Follow-up    Additional comments: F/u on DM pt checks bs @ night some different reading.      Last edited by Estill Hemming, CMA on 06/22/2023  2:13 PM.       Discussed the use of AI scribe software for clinical note transcription with the patient, who gave verbal consent to proceed.  History of Present Illness Patrick [name] is a 48 year old male with uncontrolled diabetes who presents for follow-up on his diabetes management and related symptoms.  He monitors his blood glucose with finger pricks but has not been using a glucometer consistently. He was prescribed metformin  2000 mg daily but has run out and has not taken it for an unspecified period. His last A1c was 9.9. He experiences foot pain described as 'rocking on rocks' upon waking, with aching and sharp pain. Gabapentin  was prescribed but not obtained from the pharmacy. No swelling, rash, or visual problems are noted.  He has elevated blood pressure and experienced a cough this morning, which he believes may have affected his blood pressure reading. No chest pain, shortness of breath, or palpitations are present.  He has elevated cholesterol with recent levels above 139 and is not on any cholesterol medication. He smokes half a pack of cigarettes daily for 20 years and consumes three light beers per day. He works in a [employer], involving standing and lifting.       07/01/2021    1:58 PM  Depression screen PHQ 2/9  Decreased Interest 0  Down, Depressed, Hopeless 0  PHQ - 2 Score 0  Altered sleeping 0  Tired, decreased energy 0  Change in appetite 0  Feeling bad or failure about yourself  0  Trouble  concentrating 0  Moving slowly or fidgety/restless 0  Suicidal thoughts 0  PHQ-9 Score 0  Difficult doing work/chores Not difficult at all       No data to display          Medications: Outpatient Medications Prior to Visit  Medication Sig   atorvastatin  (LIPITOR) 20 MG tablet Take 1 tablet (20 mg total) by mouth daily.   Continuous Blood Gluc Sensor (FREESTYLE LIBRE 2 SENSOR) MISC Apply 1 each topically every 14 (fourteen) days.   gabapentin  (NEURONTIN ) 100 MG capsule Take 1 capsule (100 mg total) by mouth at bedtime.   metFORMIN  (GLUCOPHAGE ) 500 MG tablet Take 500 mg BID with meals x 7 days, increase to 1000 mg in Am and 500 mg in PM x 7 days, and then to 1000 mg in Am and 1000 mg in PM   [DISCONTINUED] atorvastatin  (LIPITOR) 20 MG tablet Take 1 tablet (20 mg total) by mouth daily.   [DISCONTINUED] gabapentin  (NEURONTIN ) 100 MG capsule Take 1 capsule (100 mg total) by mouth at bedtime. (Patient not taking: Reported on 06/08/2023)   [DISCONTINUED] metFORMIN  (GLUCOPHAGE ) 500 MG tablet Take 500 mg BID with meals x 7 days, increase to 1000 mg in Am and 500 mg in PM x 7 days, and then to 1000 mg in Am and 1000 mg in PM   No facility-administered medications  prior to visit.    Review of Systems All negative Except see HPI       Objective    BP 138/81 (BP Location: Right Arm, Patient Position: Sitting, Cuff Size: Normal)   Pulse 77   Resp 16   Ht 5\' 5"  (1.651 m)   Wt 207 lb (93.9 kg)   SpO2 100%   BMI 34.45 kg/m     Physical Exam Vitals reviewed.  Constitutional:      General: He is not in acute distress.    Appearance: Normal appearance. He is not diaphoretic.  HENT:     Head: Normocephalic and atraumatic.  Eyes:     General: No scleral icterus.    Conjunctiva/sclera: Conjunctivae normal.  Cardiovascular:     Rate and Rhythm: Normal rate and regular rhythm.     Pulses: Normal pulses.     Heart sounds: Normal heart sounds. No murmur heard. Pulmonary:     Effort:  Pulmonary effort is normal. No respiratory distress.     Breath sounds: Normal breath sounds. No wheezing or rhonchi.  Musculoskeletal:     Cervical back: Neck supple.     Right lower leg: No edema.     Left lower leg: No edema.  Lymphadenopathy:     Cervical: No cervical adenopathy.  Skin:    General: Skin is warm and dry.     Findings: No rash.  Neurological:     Mental Status: He is alert and oriented to person, place, and time. Mental status is at baseline.  Psychiatric:        Mood and Affect: Mood normal.        Behavior: Behavior normal.      No results found for any visits on 06/22/23.      Assessment & Plan Type 2 diabetes mellitus with hyperglycemia Chronic condition with poor control, A1c 9.9%. Out of metformin . Discussed medication adherence, blood glucose monitoring, and complications of uncontrolled diabetes. Libre device discussed but insurance may not approve. - Prescribe metformin  2000 mg per day. - Instruct to report any issues with medication delivery. - Schedule follow-up in 6 weeks.  Peripheral neuropathy Symptoms include pain and tingling in feet. Has not received gabapentin . Discussed symptom management and potential neurology evaluation. - Prescribe gabapentin . - Check with pharmacy regarding gabapentin  prescription. Pt advised against alcohol use with this medication - Refer to neurologist for further evaluation.  Hyperlipidemia Chronic Elevated cholesterol, LDL >139 mg/dL. Discussed cholesterol management to reduce cardiovascular risk. - Prescribe atorvastatin  (Lipitor).  Proteinuria Moderately increased protein in urine, possible kidney involvement. Discussed potential nephrology referral. - Consider repeating urine test to confirm proteinuria. - Refer to nephrologist if proteinuria is confirmed.  Nicotine dependence Chronic tobacco use, half a pack per day for 20 years.  Alcohol use disorder Chronic alcohol use, three light beers per day  for 20 years. - Advise on reducing alcohol intake. - Discuss potential need for support or resources for alcohol cessation.  General Health Maintenance Needs follow-up colonoscopy due to previous polyps and suboptimal preparation. Has not had a diabetic eye exam. - Referral to gastroenterologist for colonoscopy. - Schedule diabetic eye exam with ophthalmologist.  Irregular heartbeat Normal hr Denies sob, palpitations, cp Will follow-up  Elevated liver enzymes Last liver enzyme were elevated. If pt agree will check his liver and proceed with acute hepatitis panel Will follow-up  Colon cancer screening Previously done colonoscopy in 06/2021, was advised to repeat in 6 mo - Ambulatory referral to Gastroenterology  Diabetic  eye exam (HCC) - Ambulatory referral to Ophthalmology  Neuropathy - gabapentin  (NEURONTIN ) 100 MG capsule; Take 1 capsule (100 mg total) by mouth 3 (three) times daily.  Dispense: 30 capsule; Refill: 1  Alcohol use Excessive alcohol use 3 cans/bottles of beer everyday Cessation advised Will follow-up  No orders of the defined types were placed in this encounter.   No follow-ups on file.   The patient was advised to call back or seek an in-person evaluation if the symptoms worsen or if the condition fails to improve as anticipated.  I discussed the assessment and treatment plan with the patient. The patient was provided an opportunity to ask questions and all were answered. The patient agreed with the plan and demonstrated an understanding of the instructions.  I, Randee Huston, PA-C have reviewed all documentation for this visit. The documentation on 06/22/2023  for the exam, diagnosis, procedures, and orders are all accurate and complete.  Blane Bunting, [facility], MMS [facility] [phone] (phone) [phone] (fax)  [facility]

## 2023-08-03 ENCOUNTER — Ambulatory Visit: Admitting: Physician Assistant

## 2023-08-19 ENCOUNTER — Encounter: Payer: Self-pay | Admitting: Family Medicine

## 2023-08-19 ENCOUNTER — Ambulatory Visit (INDEPENDENT_AMBULATORY_CARE_PROVIDER_SITE_OTHER): Admitting: Family Medicine

## 2023-08-19 VITALS — BP 115/83 | HR 79 | Resp 16 | Ht 66.0 in | Wt 210.4 lb

## 2023-08-19 DIAGNOSIS — Z800 Family history of malignant neoplasm of digestive organs: Secondary | ICD-10-CM

## 2023-08-19 DIAGNOSIS — G629 Polyneuropathy, unspecified: Secondary | ICD-10-CM | POA: Diagnosis not present

## 2023-08-19 DIAGNOSIS — R748 Abnormal levels of other serum enzymes: Secondary | ICD-10-CM | POA: Diagnosis not present

## 2023-08-19 DIAGNOSIS — E1165 Type 2 diabetes mellitus with hyperglycemia: Secondary | ICD-10-CM

## 2023-08-19 DIAGNOSIS — D3A8 Other benign neuroendocrine tumors: Secondary | ICD-10-CM | POA: Diagnosis not present

## 2023-08-19 DIAGNOSIS — Z860101 Personal history of adenomatous and serrated colon polyps: Secondary | ICD-10-CM

## 2023-08-19 DIAGNOSIS — Z7984 Long term (current) use of oral hypoglycemic drugs: Secondary | ICD-10-CM

## 2023-08-19 NOTE — Progress Notes (Signed)
 Established patient visit   Patient: Patrick [name]   DOB: [birth year]   48 y.o. Male  MRN: [id number] Visit Date: 08/19/2023  Today's healthcare provider: Nancyann Perry, MD   Chief Complaint  Patient presents with   Medical Management of Chronic Issues   Subjective    Discussed the use of AI scribe software for clinical note transcription with the patient, who gave verbal consent to proceed.  History of Present Illness   Patrick [name] is a 48 year old male with diabetes who presents for follow-up on his diabetes management.  He has been taking metformin  consistently every day since his last visit, after previously running out before his a1c was checked at that visit. He takes three tablets daily, two with lunch and one with gabapentin  at night, and monitors his blood sugar levels mainly at night, with a recent reading of 157 mg/dL. He experiences no side effects from metformin  and tolerates it well.  He has a history of elevated liver function tests noted in May, which have been persistently high the last few years. He has not undergone an ultrasound of his liver.  He takes gabapentin  for numbness and tingling in his feet, described as feeling like 'walking on rocks' in the morning. He takes one tablet at night, although his prescription is for 100mg  three times daily regimen. He is unsure of its effectiveness as he still experiences symptoms in the morning. He suspects his work shoes, which are flat, may contribute to his symptoms as he is on his feet all day.  He has a family history of colon cancer, with his father having had the condition. He previously had a colonoscopy where 14 polyps were found. He has an upcoming colonoscopy scheduled for September 25th.       Medications: Outpatient Medications Prior to Visit  Medication Sig   atorvastatin  (LIPITOR) 20 MG tablet Take 1 tablet (20 mg total) by mouth daily.   Continuous Blood Gluc Sensor (FREESTYLE LIBRE 2 SENSOR)  MISC Apply 1 each topically every 14 (fourteen) days.   gabapentin  (NEURONTIN ) 100 MG capsule Take 1 capsule (100 mg total) by mouth 3 (three) times daily. (Patient taking differently: Take 100 mg by mouth at bedtime.)   metFORMIN  (GLUCOPHAGE ) 500 MG tablet Take 500 mg BID with meals x 7 days, increase to 1000 mg in Am and 500 mg in PM x 7 days, and then to 1000 mg in Am and 1000 mg in PM (Patient taking differently: Taking 2 every day at lunch time and one every night)   No facility-administered medications prior to visit.   Review of Systems  Respiratory: Negative.  Negative for cough, shortness of breath and wheezing.   Cardiovascular:  Negative for chest pain, palpitations and leg swelling.  Neurological:  Negative for weakness and headaches.       Objective    BP 115/83 (BP Location: Left Arm, Patient Position: Sitting, Cuff Size: Normal)   Pulse 79   Resp 16   Ht 5' 6 (1.676 m)   Wt 210 lb 6.4 oz (95.4 kg)   SpO2 100%   BMI 33.96 kg/m   Physical Exam   General appearance: Mildly obese male, cooperative and in no acute distress Head: Normocephalic, without obvious abnormality, atraumatic Respiratory: Respirations even and unlabored, normal respiratory rate Extremities: All extremities are intact.  Skin: Skin color, texture, turgor normal. No rashes seen  Psych: Appropriate mood and affect. Neurologic: Mental status: Alert, oriented to  person, place, and time, thought content appropriate.    Assessment & Plan    1. Type 2 diabetes mellitus with hyperglycemia, without long-term current use of insulin (HCC) (Primary) Now taking 3 x 500mg  metformin  every day consistently.   - Hemoglobin A1c  2. Elevated liver enzymes Suspect fatty liver.  - Comprehensive metabolic panel with GFR - US  Abdomen Limited RUQ (LIVER/GB); Future  3. Neuropathy Advised will need higher dose of gabapentin  to be effective. Can try taking 100 TID, or 3 x 100 at bedtime if he experience any  sedation from medication.   4. Neuroendocrine tumor of sigmoid colon   5. History of adenomatous and serrated colon polyps Patient reports he is scheduled for follow up colonoscopy in September. Reiterated his high risk of recurrent tumors and colon cancer and It is vitally important to follow through with recommended surveillance.   6. Family history of colon cancer        Nancyann Perry, MD  [facility] [phone] (phone) [phone] (fax)  [facility]

## 2023-08-20 ENCOUNTER — Ambulatory Visit: Payer: Self-pay | Admitting: Family Medicine

## 2023-08-20 DIAGNOSIS — E1165 Type 2 diabetes mellitus with hyperglycemia: Secondary | ICD-10-CM

## 2023-08-20 LAB — COMPREHENSIVE METABOLIC PANEL WITH GFR
ALT: 52 IU/L — ABNORMAL HIGH (ref 0–44)
AST: 42 IU/L — ABNORMAL HIGH (ref 0–40)
Albumin: 4.6 g/dL (ref 4.1–5.1)
Alkaline Phosphatase: 95 IU/L (ref 44–121)
BUN/Creatinine Ratio: 9 (ref 9–20)
BUN: 8 mg/dL (ref 6–24)
Bilirubin Total: 0.3 mg/dL (ref 0.0–1.2)
CO2: 20 mmol/L (ref 20–29)
Calcium: 9.7 mg/dL (ref 8.7–10.2)
Chloride: 100 mmol/L (ref 96–106)
Creatinine, Ser: 0.9 mg/dL (ref 0.76–1.27)
Globulin, Total: 2.8 g/dL (ref 1.5–4.5)
Glucose: 215 mg/dL — ABNORMAL HIGH (ref 70–99)
Potassium: 5 mmol/L (ref 3.5–5.2)
Sodium: 136 mmol/L (ref 134–144)
Total Protein: 7.4 g/dL (ref 6.0–8.5)
eGFR: 105 mL/min/1.73 (ref >59)

## 2023-08-20 LAB — HEMOGLOBIN A1C
Est. average glucose Bld gHb Est-mCnc: 235 mg/dL
Hgb A1c MFr Bld: 9.8 % — ABNORMAL HIGH (ref 4.8–5.6)

## 2023-08-26 ENCOUNTER — Ambulatory Visit

## 2023-09-05 ENCOUNTER — Ambulatory Visit: Admission: RE | Admit: 2023-09-05 | Source: Ambulatory Visit

## 2023-09-29 ENCOUNTER — Ambulatory Visit
Admission: RE | Admit: 2023-09-29 | Discharge: 2023-09-29 | Disposition: A | Discharge status: Home / Self Care | Source: Ambulatory Visit | Attending: Family Medicine | Admitting: Family Medicine

## 2023-09-29 DIAGNOSIS — R748 Abnormal levels of other serum enzymes: Secondary | ICD-10-CM | POA: Insufficient documentation

## 2023-10-05 MED ORDER — PIOGLITAZONE HCL 30 MG PO TABS: 30.0000 mg | ORAL_TABLET | Freq: Every day | ORAL | 2 refills | Status: AC

## 2023-10-05 NOTE — Addendum Note (Signed)
 Addended by: GASPER NANCYANN BRAVO on: 10/05/2023 05:08 PM   Modules accepted: Orders

## 2023-10-06 ENCOUNTER — Telehealth: Payer: Self-pay

## 2023-10-06 NOTE — Telephone Encounter (Signed)
 Copied from CRM #[id number]. Topic: Clinical - Lab/Test Results >> Oct 06, 2023 11:55 AM Antwanette L wrote: Reason for CRM: Pt is returning a missed call about his ultrasound results.  Pt had an ultrasound on 09/29/23. Please follow up w/ the pt at [phone]

## 2023-10-06 NOTE — Telephone Encounter (Signed)
 Called and spoke to the pt regarding his ultrasound recommended medication and follow up appt with his PcP.

## 2023-10-06 NOTE — Progress Notes (Signed)
 Called and spoke to the pt he is aware of his lab results and has already been scheduled

## 2023-12-07 ENCOUNTER — Ambulatory Visit (INDEPENDENT_AMBULATORY_CARE_PROVIDER_SITE_OTHER): Admitting: Physician Assistant

## 2023-12-07 ENCOUNTER — Encounter: Payer: Self-pay | Admitting: Physician Assistant

## 2023-12-07 VITALS — BP 130/77 | HR 84 | Temp 98.4°F | Ht 66.0 in | Wt 212.0 lb

## 2023-12-07 DIAGNOSIS — K760 Fatty (change of) liver, not elsewhere classified: Secondary | ICD-10-CM | POA: Diagnosis not present

## 2023-12-07 DIAGNOSIS — E66811 Obesity, class 1: Secondary | ICD-10-CM

## 2023-12-07 DIAGNOSIS — F1090 Alcohol use, unspecified, uncomplicated: Secondary | ICD-10-CM | POA: Diagnosis not present

## 2023-12-07 DIAGNOSIS — R748 Abnormal levels of other serum enzymes: Secondary | ICD-10-CM | POA: Diagnosis not present

## 2023-12-07 DIAGNOSIS — E785 Hyperlipidemia, unspecified: Secondary | ICD-10-CM

## 2023-12-07 DIAGNOSIS — E1169 Type 2 diabetes mellitus with other specified complication: Secondary | ICD-10-CM

## 2023-12-07 DIAGNOSIS — E1165 Type 2 diabetes mellitus with hyperglycemia: Secondary | ICD-10-CM

## 2023-12-07 DIAGNOSIS — Z7984 Long term (current) use of oral hypoglycemic drugs: Secondary | ICD-10-CM

## 2023-12-07 NOTE — Progress Notes (Signed)
 Established patient visit  Patient: Patrick [name]   DOB: 10-Oct-1975   48 y.o. Male  MRN: [id number] Visit Date: 12/07/2023  Today's healthcare provider: Jolynn Spencer, PA-C   Chief Complaint  Patient presents with   Diabetes    Patient presents for follow up of diabetes and fatty liver.  He was last seen in July.  Patient reports glucose reading of 152-287.  Patient also reports symptoms of neuropathy.  Patient was given Gabapentin  and was to take TID but misunderstood instructions and has only been taking 1 daily.  He said he is not getting relief from the Gabapentin  but had not been taking correctly.   Subjective     HPI     Diabetes    Additional comments: Patient presents for follow up of diabetes and fatty liver.  He was last seen in July.  Patient reports glucose reading of 152-287.  Patient also reports symptoms of neuropathy.  Patient was given Gabapentin  and was to take TID but misunderstood instructions and has only been taking 1 daily.  He said he is not getting relief from the Gabapentin  but had not been taking correctly.      Last edited by Desanto, Elena D, CMA on 12/07/2023  1:59 PM.       Discussed the use of AI scribe software for clinical note transcription with the patient, who gave verbal consent to proceed.  History of Present Illness Patrick [name] is a 48 year old male with diabetes who presents with elevated liver enzymes and potential fatty liver disease.  Liver enzymes are elevated with potential concern for fatty liver disease. No symptoms of nausea, vomiting, jaundice, or changes in stool or urine color. Previous testing for hepatitis C is noted, but testing for hepatitis A or B is unclear.  He consumes approximately two beers daily and acknowledges the need to reduce alcohol intake. He takes unspecified medication for his liver.  He is physically active at work and drinks black coffee daily. Diabetes management is noted as important for addressing  liver issues, though current medications or strategies are not detailed.       08/19/2023    2:59 PM 06/22/2023    4:47 PM 07/01/2021    1:58 PM  Depression screen PHQ 2/9  Decreased Interest 0 1 0  Down, Depressed, Hopeless 0 0 0  PHQ - 2 Score 0 1 0  Altered sleeping 0 1 0  Tired, decreased energy   0  Change in appetite 0 0 0  Feeling bad or failure about yourself  0 0 0  Trouble concentrating 0 0 0  Moving slowly or fidgety/restless 0 0 0  Suicidal thoughts 0 0 0  PHQ-9 Score 0  2  0   Difficult doing work/chores Not difficult at all Not difficult at all Not difficult at all     Data saved with a previous flowsheet row definition      08/19/2023    2:59 PM 06/22/2023    4:47 PM  GAD 7 : Generalized Anxiety Score  Nervous, Anxious, on Edge 0 0  Control/stop worrying 0 0  Worry too much - different things 0 0  Trouble relaxing 0 0  Restless 0 0  Easily annoyed or irritable 0 0  Afraid - awful might happen 0 0  Total GAD 7 Score 0 0  Anxiety Difficulty Not difficult at all Not difficult at all    Medications: Outpatient Medications Prior to Visit  Medication Sig  atorvastatin  (LIPITOR) 20 MG tablet Take 1 tablet (20 mg total) by mouth daily.   Continuous Blood Gluc Sensor (FREESTYLE LIBRE 2 SENSOR) MISC Apply 1 each topically every 14 (fourteen) days.   gabapentin  (NEURONTIN ) 100 MG capsule Take 1 capsule (100 mg total) by mouth 3 (three) times daily.   metFORMIN  (GLUCOPHAGE ) 500 MG tablet Take 500 mg BID with meals x 7 days, increase to 1000 mg in Am and 500 mg in PM x 7 days, and then to 1000 mg in Am and 1000 mg in PM   pioglitazone  (ACTOS ) 30 MG tablet Take 1 tablet (30 mg total) by mouth daily.   No facility-administered medications prior to visit.    Review of Systems All negative Except see HPI       Objective    BP 130/77 (BP Location: Left Arm, Patient Position: Sitting, Cuff Size: Large)   Pulse 84   Temp 98.4 F (36.9 C) (Oral)   Ht 5' 6 (1.676  m)   Wt 212 lb (96.2 kg)   SpO2 100%   BMI 34.22 kg/m     Physical Exam Vitals reviewed.  Constitutional:      General: He is not in acute distress.    Appearance: Normal appearance. He is obese. He is not diaphoretic.  HENT:     Head: Normocephalic and atraumatic.  Eyes:     General: No scleral icterus.    Conjunctiva/sclera: Conjunctivae normal.  Cardiovascular:     Rate and Rhythm: Normal rate and regular rhythm.     Pulses: Normal pulses.     Heart sounds: Normal heart sounds. No murmur heard. Pulmonary:     Effort: Pulmonary effort is normal. No respiratory distress.     Breath sounds: Normal breath sounds. No wheezing or rhonchi.  Musculoskeletal:     Cervical back: Neck supple.     Right lower leg: No edema.     Left lower leg: No edema.  Lymphadenopathy:     Cervical: No cervical adenopathy.  Skin:    General: Skin is warm and dry.     Findings: No rash.  Neurological:     Mental Status: He is alert and oriented to person, place, and time. Mental status is at baseline.  Psychiatric:        Mood and Affect: Mood normal.        Behavior: Behavior normal.      No results found for any visits on 12/07/23.      Assessment & Plan Obesity Chronic and stable Associated with DMII, HLD Contributing to elevated blood pressure and complicating management of other conditions. - Encouraged weight loss through diet and exercise. - Recommended low-carb diet and small portion sizes. - Advised 150 minutes of exercise per week.  Type 2 Diabetes Mellitus Chronic and could be stable Diabetes management is crucial for overall health and to prevent complications. Continue metformin  - 2000mg  ! daily. Pt has been taking 1500mg . Continue Actos  30mg  - Encouraged adherence to a low-carb diet. - Advised regular exercise. - Will monitor blood glucose levels. Will follow-up  Fatty liver disease with elevated liver enzymes Recent CT abdomen showed fatty liver disease.  -  Ordered hepatitis panel to rule out viral hepatitis. - Referred to gastroenterology for further evaluation, including FibroScan. - Advised cessation of alcohol consumption. - Recommended reducing Tylenol use. - Encouraged weight loss and healthy diet. Will follow-up  Alcohol use Chronic Regular alcohol consumption is contributing to liver enzyme elevation and potential liver damage. -  Advised complete cessation of alcohol consumption.  General Health Maintenance Discussed the importance of regular screenings and lifestyle modifications for overall health. - Encouraged regular exercise and healthy diet. - Advised on the benefits of coffee and dark chocolate for liver health.  Obesity (BMI 30.0-34.9) (Primary)  - Hemoglobin A1c - Comprehensive metabolic panel with GFR - Lipid Panel With LDL/HDL Ratio - Ambulatory referral to Gastroenterology - Hepatitis, Acute  Hepatic steatosis - Comprehensive metabolic panel with GFR  Elevated liver enzymes - Comprehensive metabolic panel with GFR  Hyperlipidemia associated with type 2 diabetes mellitus (HCC) Chronic and previously unstable Ldl from 06/08/23 139 The 10-year ASCVD risk score (Arnett DK, et al., 2019) is: 15.8% Continue atorvastatin  20mg  Continue lifestyle modifications Will follow-up   Orders Placed This Encounter  Procedures   Hemoglobin A1c   Comprehensive metabolic panel with GFR   Lipid Panel With LDL/HDL Ratio   Hepatitis, Acute   Ambulatory referral to Gastroenterology    Referral Priority:   Routine    Referral Type:   Consultation    Referral Reason:   Specialty Services Required    Referred to Provider:   Therisa Bi, MD    Number of Visits Requested:   1    Return in about 4 weeks (around 01/04/2024) for chronic disease f/u.   The patient was advised to call back or seek an in-person evaluation if the symptoms worsen or if the condition fails to improve as anticipated.  I discussed the assessment and  treatment plan with the patient. The patient was provided an opportunity to ask questions and all were answered. The patient agreed with the plan and demonstrated an understanding of the instructions.  I, Cristhian Vanhook, PA-C have reviewed all documentation for this visit. The documentation on 12/07/2023  for the exam, diagnosis, procedures, and orders are all accurate and complete.  Jolynn Spencer, [facility], MMS [facility] [phone] (phone) [phone] (fax)  [facility]

## 2023-12-08 ENCOUNTER — Ambulatory Visit: Payer: Self-pay | Admitting: Physician Assistant

## 2023-12-08 DIAGNOSIS — E1165 Type 2 diabetes mellitus with hyperglycemia: Secondary | ICD-10-CM

## 2023-12-08 LAB — HCV INTERPRETATION

## 2023-12-08 LAB — COMPREHENSIVE METABOLIC PANEL WITH GFR
ALT: 36 IU/L (ref 0–44)
AST: 36 IU/L (ref 0–40)
Albumin: 4.3 g/dL (ref 4.1–5.1)
Alkaline Phosphatase: 89 IU/L (ref 47–123)
BUN/Creatinine Ratio: 10 (ref 9–20)
BUN: 8 mg/dL (ref 6–24)
Bilirubin Total: 0.4 mg/dL (ref 0.0–1.2)
CO2: 20 mmol/L (ref 20–29)
Calcium: 9 mg/dL (ref 8.7–10.2)
Chloride: 99 mmol/L (ref 96–106)
Creatinine, Ser: 0.79 mg/dL (ref 0.76–1.27)
Globulin, Total: 2.5 g/dL (ref 1.5–4.5)
Glucose: 177 mg/dL — ABNORMAL HIGH (ref 70–99)
Potassium: 4.8 mmol/L (ref 3.5–5.2)
Sodium: 131 mmol/L — ABNORMAL LOW (ref 134–144)
Total Protein: 6.8 g/dL (ref 6.0–8.5)
eGFR: 109 mL/min/1.73 (ref >59)

## 2023-12-08 LAB — ACUTE VIRAL HEPATITIS (HAV, HBV, HCV)
HCV Ab: NONREACTIVE
Hep A IgM: NEGATIVE
Hep B C IgM: NEGATIVE
Hepatitis B Surface Ag: NEGATIVE

## 2023-12-08 LAB — HEMOGLOBIN A1C
Est. average glucose Bld gHb Est-mCnc: 298 mg/dL
Hgb A1c MFr Bld: 12 % — ABNORMAL HIGH (ref 4.8–5.6)

## 2023-12-08 LAB — LIPID PANEL WITH LDL/HDL RATIO
Cholesterol, Total: 191 mg/dL (ref 100–199)
HDL: 47 mg/dL (ref >39)
LDL Chol Calc (NIH): 116 mg/dL — ABNORMAL HIGH (ref 0–99)
LDL/HDL Ratio: 2.5 ratio (ref 0.0–3.6)
Triglycerides: 160 mg/dL — ABNORMAL HIGH (ref 0–149)
VLDL Cholesterol Cal: 28 mg/dL (ref 5–40)

## 2023-12-08 MED ORDER — OZEMPIC (0.25 OR 0.5 MG/DOSE) 2 MG/3ML ~~LOC~~ SOPN: 0.2500 mg | PEN_INJECTOR | SUBCUTANEOUS | 1 refills | Status: AC

## 2023-12-13 ENCOUNTER — Other Ambulatory Visit (HOSPITAL_COMMUNITY): Payer: Self-pay

## 2023-12-13 ENCOUNTER — Telehealth: Payer: Self-pay | Admitting: Pharmacy Technician

## 2023-12-13 NOTE — Telephone Encounter (Signed)
 Pharmacy Patient Advocate Encounter   Received notification from Onbase that prior authorization for Ozempic 0.25 or 0.5mg  is required/requested.   Insurance verification completed.   The patient is insured through [facility].   Per test claim: PA required; PA submitted to above mentioned insurance via Latent Key/confirmation #/EOC [id number] Status is pending

## 2023-12-14 ENCOUNTER — Telehealth: Payer: Self-pay

## 2023-12-14 NOTE — Telephone Encounter (Signed)
 Medication has been sent for Prior auth, awaiting response.

## 2023-12-14 NOTE — Telephone Encounter (Signed)
 Copied from CRM #[id number]. Topic: Clinical - Prescription Issue >> Dec 14, 2023  3:48 PM Rosaria BRAVO wrote: Reason for CRM: Pt states that his insurance rejected his ozempic . Pt has questions about what to expect next, please advise  Best contact: [phone]

## 2023-12-15 ENCOUNTER — Ambulatory Visit

## 2023-12-15 ENCOUNTER — Other Ambulatory Visit (HOSPITAL_COMMUNITY): Payer: Self-pay

## 2023-12-15 NOTE — Telephone Encounter (Signed)
 Pharmacy Patient Advocate Encounter  Received notification from [facility] that Prior Authorization for Ozempic  has been APPROVED from now to 12/12/24. Ran test claim, Copay is $4. This test claim was processed through [facility]- copay amounts may vary at other pharmacies due to pharmacy/plan contracts, or as the patient moves through the different stages of their insurance plan.   PA #/Case ID/Reference #: [id number]

## 2024-01-05 ENCOUNTER — Ambulatory Visit (INDEPENDENT_AMBULATORY_CARE_PROVIDER_SITE_OTHER): Admitting: Physician Assistant

## 2024-01-05 ENCOUNTER — Encounter: Payer: Self-pay | Admitting: Physician Assistant

## 2024-01-05 ENCOUNTER — Ambulatory Visit (INDEPENDENT_AMBULATORY_CARE_PROVIDER_SITE_OTHER)

## 2024-01-05 VITALS — BP 127/79 | HR 76 | Ht 65.0 in | Wt 215.5 lb

## 2024-01-05 DIAGNOSIS — F1090 Alcohol use, unspecified, uncomplicated: Secondary | ICD-10-CM

## 2024-01-05 DIAGNOSIS — Z7985 Long-term (current) use of injectable non-insulin antidiabetic drugs: Secondary | ICD-10-CM | POA: Diagnosis not present

## 2024-01-05 DIAGNOSIS — E1169 Type 2 diabetes mellitus with other specified complication: Secondary | ICD-10-CM | POA: Diagnosis not present

## 2024-01-05 DIAGNOSIS — K760 Fatty (change of) liver, not elsewhere classified: Secondary | ICD-10-CM

## 2024-01-05 DIAGNOSIS — E1165 Type 2 diabetes mellitus with hyperglycemia: Secondary | ICD-10-CM

## 2024-01-05 DIAGNOSIS — Z7984 Long term (current) use of oral hypoglycemic drugs: Secondary | ICD-10-CM

## 2024-01-05 DIAGNOSIS — E785 Hyperlipidemia, unspecified: Secondary | ICD-10-CM

## 2024-01-05 DIAGNOSIS — E66811 Obesity, class 1: Secondary | ICD-10-CM

## 2024-01-05 DIAGNOSIS — G629 Polyneuropathy, unspecified: Secondary | ICD-10-CM | POA: Diagnosis not present

## 2024-01-05 MED ORDER — GABAPENTIN 100 MG PO CAPS: 100.0000 mg | ORAL_CAPSULE | Freq: Three times a day (TID) | ORAL | 1 refills | Status: DC

## 2024-01-05 NOTE — Progress Notes (Signed)
° °  01/05/2024 Name: Patrick [name] MRN: [id number] DOB: 08/23/1975  No chief complaint on file.   Patrick [name] is a 48 y.o. year old male who was referred for medication management by their primary care provider, Ostwalt, Janna, PA-C. They presented for a face to face visit today.   They were scheduled with their PCP for today and requested assistance with injection technique for Ozempic .    Subjective:  Care Team: Primary Care Provider: Ostwalt, Janna, PA-C   Medication Access/Adherence  Current Pharmacy:  [facility] [address] ([address]), [place] - [address] [place] ([address]) [place] [address] Phone: [phone] Fax: [phone]    Objective:  Lab Results  Component Value Date   HGBA1C 12.0 (H) 12/07/2023    Lab Results  Component Value Date   CREATININE 0.79 12/07/2023   BUN 8 12/07/2023   NA 131 (L) 12/07/2023   K 4.8 12/07/2023   CL 99 12/07/2023   CO2 20 12/07/2023    Lab Results  Component Value Date   CHOL 191 12/07/2023   HDL 47 12/07/2023   LDLCALC 116 (H) 12/07/2023   TRIG 160 (H) 12/07/2023   CHOLHDL 3.7 06/08/2023    Medications Reviewed Today   Medications were not reviewed in this encounter       Assessment/Plan:   Diabetes: - Currently uncontrolled; goal A1c <7%.  - Counseled and administered Ozempic  0.25 mg weekly injection in clinic. - Discussed side effects of gastrointestinal upset/nausea; eating smaller meals, avoiding high-fat foods, and remaining upright after eating may reduce nausea. Discussed that overeating is a major trigger of nausea with this class of medications, as often times patients will start to feel full sooner and may need to decrease portion sizes from what they were previously accustomed to.    Follow Up Plan:  - Follow up with clinical pharmacist for DM management in 3 weeks  Peyton CHARLENA Ferries, PharmD, CPP Clinical Pharmacist [facility] [phone]

## 2024-01-06 MED ORDER — GABAPENTIN 100 MG PO CAPS: 100.0000 mg | ORAL_CAPSULE | Freq: Three times a day (TID) | ORAL | 3 refills | Status: AC

## 2024-01-06 NOTE — Progress Notes (Signed)
 Established patient visit  Patient: Patrick [name]   DOB: [birth year]   48 y.o. Male  MRN: [id number] Visit Date: 01/05/2024  Today's healthcare provider: Jolynn Spencer, PA-C   Chief Complaint  Patient presents with   Follow-up    1 month f/u  Referral to foot- gabapentin  does not work for pt   Subjective     HPI     Follow-up    Additional comments: 1 month f/u  Referral to foot- gabapentin  does not work for pt      Last edited by Wilfred Hargis RAMAN, CMA on 01/05/2024  1:24 PM.       Discussed the use of AI scribe software for clinical note transcription with the patient, who gave verbal consent to proceed.  History of Present Illness Patrick DUDEK is a 48 year old male with diabetes who presents for follow-up regarding his diabetes management and weight loss.  He is taking metformin  2000 mg daily, increased from 1500 mg, and Actos  30 mg daily. He started a new medication today for diabetes management and weight loss. Recent fasting blood sugars are 154-155 mg/dL, improved from 832 mg/dL but still above the target 80-130 mg/dL.  He walks on a treadmill for 30 minutes on Mondays, Wednesdays, and Fridays and does weight training. He is limiting carbohydrates and increasing protein, including boiled eggs.  He takes atorvastatin  20 mg inconsistently. He has run out of gabapentin , previously prescribed for nerve pain in his feet. He notes tingling in both feet.  He denies vision changes, chest pain, shortness of breath, palpitations, or weakness. He continues to have tingling in both feet.  He is trying to cut down on tobacco and currently smokes four to five cigarettes daily. His wife quit smoking a year ago and is encouraging him to quit.       01/05/2024    1:22 PM 08/19/2023    2:59 PM 06/22/2023    4:47 PM  Depression screen PHQ 2/9  Decreased Interest 0 0 1  Down, Depressed, Hopeless 0 0 0  PHQ - 2 Score 0 0 1  Altered sleeping 0 0 1  Tired, decreased energy 0     Change in appetite 0 0 0  Feeling bad or failure about yourself  0 0 0  Trouble concentrating 0 0 0  Moving slowly or fidgety/restless 0 0 0  Suicidal thoughts 0 0 0  PHQ-9 Score 0 0  2   Difficult doing work/chores Not difficult at all Not difficult at all Not difficult at all     Data saved with a previous flowsheet row definition      01/05/2024    1:23 PM 08/19/2023    2:59 PM 06/22/2023    4:47 PM  GAD 7 : Generalized Anxiety Score  Nervous, Anxious, on Edge 0 0 0  Control/stop worrying 0 0 0  Worry too much - different things 0 0 0  Trouble relaxing 0 0 0  Restless 0 0 0  Easily annoyed or irritable 0 0 0  Afraid - awful might happen 0 0 0  Total GAD 7 Score 0 0 0  Anxiety Difficulty Not difficult at all Not difficult at all Not difficult at all    Medications: Show/hide medication list[1]  Review of Systems All negative Except see HPI       Objective    BP 127/79   Pulse 76   Ht 5' 5 (1.651 m)   Wt 215 lb 8 oz (  97.8 kg)   SpO2 98%   BMI 35.86 kg/m     Physical Exam Vitals reviewed.  Constitutional:      General: He is not in acute distress.    Appearance: Normal appearance. He is not diaphoretic.  HENT:     Head: Normocephalic and atraumatic.  Eyes:     General: No scleral icterus.    Conjunctiva/sclera: Conjunctivae normal.  Cardiovascular:     Rate and Rhythm: Normal rate and regular rhythm.     Pulses: Normal pulses.     Heart sounds: Normal heart sounds. No murmur heard. Pulmonary:     Effort: Pulmonary effort is normal. No respiratory distress.     Breath sounds: Normal breath sounds. No wheezing or rhonchi.  Musculoskeletal:     Cervical back: Neck supple.     Right lower leg: No edema.     Left lower leg: No edema.  Lymphadenopathy:     Cervical: No cervical adenopathy.  Skin:    General: Skin is warm and dry.     Findings: No rash.  Neurological:     Mental Status: He is alert and oriented to person, place, and time. Mental  status is at baseline.  Psychiatric:        Mood and Affect: Mood normal.        Behavior: Behavior normal.      No results found for any visits on 01/05/24.      Assessment & Plan Type 2 diabetes mellitus with hyperglycemia and associated complications Chronic and unstable Managed with metformin , Actos , and Ozempic . Fasting glucose elevated. Emphasized lifestyle modifications. Discussed importance of glucose control to prevent complications. - Continue metformin  2000 mg daily. - Continue Actos  30 mcg daily. - Started Ozempic  as prescribed. - Monitor blood glucose levels regularly, especially fasting levels. - Encouraged regular exercise, aiming for daily activity. - Encouraged dietary modifications, including monitoring carbohydrate intake. - Referred to neurology for neuropathy management. - Encouraged scheduling an eye exam for diabetic retinopathy screening. - Discussed potential referral to a nutritionist for dietary guidance. Will follow-up  Obesity, class 1 Hepatic steatosis Chronic and stable Associated with DMII, hyperlipidemia, hepatic steatosis Managed with lifestyle modifications and pharmacotherapy. Emphasized exercise, dietary changes, and role of Ozempic  in weight loss. - Encouraged regular exercise, aiming for daily activity. - Encouraged dietary modifications, including monitoring carbohydrate intake. - Discussed potential referral to a nutritionist for dietary guidance.  Hyperlipidemia associated with type 2 diabetes mellitus (HCC) Chronic and   Alcohol use Chronic Regular, attributed to liver enzyme elevation Cessation advised  Neuropathy Chronic and worsening In both feet Run out of gabapentin  Will resubmit rx - Ambulatory referral to Neurology - gabapentin  was called in. Will follow-up   General health maintenance Emphasized importance of vaccinations in diabetes context. - Plan for vaccinations once diabetes is better  controlled.    Orders Placed This Encounter  Procedures   Ambulatory referral to Neurology    Referral Priority:   Routine    Referral Type:   Consultation    Referral Reason:   Specialty Services Required    Requested Specialty:   Neurology    Number of Visits Requested:   1    Return in about 6 weeks (around 02/16/2024) for chronic disease f/u/foot exam.   The patient was advised to call back or seek an in-person evaluation if the symptoms worsen or if the condition fails to improve as anticipated.  I discussed the assessment and treatment plan with the patient. The  patient was provided an opportunity to ask questions and all were answered. The patient agreed with the plan and demonstrated an understanding of the instructions.  I, Zyler Hyson, PA-C have reviewed all documentation for this visit. The documentation on 01/05/2024  for the exam, diagnosis, procedures, and orders are all accurate and complete.  Jolynn Spencer, [facility], MMS [facility] [phone] (phone) [phone] (fax)  [facility]    [1]  Outpatient Medications Prior to Visit  Medication Sig   atorvastatin  (LIPITOR) 20 MG tablet Take 1 tablet (20 mg total) by mouth daily.   Continuous Blood Gluc Sensor (FREESTYLE LIBRE 2 SENSOR) MISC Apply 1 each topically every 14 (fourteen) days.   metFORMIN  (GLUCOPHAGE ) 500 MG tablet Take 500 mg BID with meals x 7 days, increase to 1000 mg in Am and 500 mg in PM x 7 days, and then to 1000 mg in Am and 1000 mg in PM   pioglitazone  (ACTOS ) 30 MG tablet Take 1 tablet (30 mg total) by mouth daily.   Semaglutide ,0.25 or 0.5MG /DOS, (OZEMPIC , 0.25 OR 0.5 MG/DOSE,) 2 MG/3ML SOPN Inject 0.25 mg into the skin once a week.   [DISCONTINUED] gabapentin  (NEURONTIN ) 100 MG capsule Take 1 capsule (100 mg total) by mouth 3 (three) times daily. (Patient not taking: Reported on 01/05/2024)   No facility-administered medications prior to visit.

## 2024-01-25 ENCOUNTER — Ambulatory Visit

## 2024-01-25 ENCOUNTER — Telehealth: Payer: Self-pay

## 2024-01-25 NOTE — Progress Notes (Deleted)
 "  S:     Reason for visit: ?  Patrick [name] is a 48 y.o. male with a history of diabetes (type 2), who presents today for a follow up diabetes Face to Face pharmacotherapy visit.? Pertinent PMH also includes tobacco use disorder and hepatic steatosis.  They were referred to the pharmacist by their PCP for assistance in managing diabetes.  Known DM Complications: {DM complications:33329}   Care Team: Primary Care Provider: Ostwalt, Janna, PA-C  At last visit, ***.   Patient reports Diabetes was diagnosed in ***.   Current diabetes medications include: Ozempic  0.25 mg weekly, pioglitazone  30 mg daily, metformin  500 mg BID Current hyperlipidemia medications include: atorvastatin  20 mg daily   Patient reports adherence to taking all medications as prescribed.  *** Patient denies adherence with medications, reports missing *** medications *** times per week, on average.  Have you been experiencing any side effects to the medications prescribed? {YES NO:22349} Do you have any problems obtaining medications due to transportation or finances? {YES [id number] Insurance coverage: [place] [employer]  Current medication access support: ***  Patient {Actions; denies-reports:120008} hypoglycemic events.  Reported home fasting blood sugars: ***  Reported 2 hour post-meal/random blood sugars: ***.  Patient {Actions; denies-reports:120008} nocturia (nighttime urination).  Patient {Actions; denies-reports:120008} neuropathy (nerve pain). Patient {Actions; denies-reports:120008} visual changes. Patient {Actions; denies-reports:120008} self foot exams.   Patient reported dietary habits: Eats *** meals/day Breakfast: *** Lunch: *** Dinner: *** Snacks: *** Drinks: ***  Patient-reported exercise habits: *** DM Prevention:  Statin: Taking; moderate intensity.?  ACE/ARB: no History of chronic kidney disease? {Blank single:19197::yes,no} Last urinary albumin/creatinine ratio:  Lab Results   Component Value Date   MICRALBCREAT 126 (H) 06/08/2023   Last eye exam:     Last foot exam: No foot exam found Tobacco Use:  Tobacco Use: High Risk (01/05/2024)   Patient History    Smoking Tobacco Use: Every Day    Smokeless Tobacco Use: Current    Passive Exposure: Not on file   O:   Vitals:  Wt Readings from Last 3 Encounters:  01/05/24 215 lb 8 oz (97.8 kg)  12/07/23 212 lb (96.2 kg)  08/19/23 210 lb 6.4 oz (95.4 kg)   BP Readings from Last 3 Encounters:  01/05/24 127/79  12/07/23 130/77  08/19/23 115/83   Pulse Readings from Last 3 Encounters:  01/05/24 76  12/07/23 84  08/19/23 79     Labs:?  Lab Results  Component Value Date   HGBA1C 12.0 (H) 12/07/2023   HGBA1C 9.8 (H) 08/19/2023   HGBA1C 9.9 (H) 06/08/2023   GLUCOSE 177 (H) 12/07/2023   MICRALBCREAT 126 (H) 06/08/2023   CREATININE 0.79 12/07/2023   CREATININE 0.90 08/19/2023   CREATININE 0.94 06/08/2023    Lab Results  Component Value Date   CHOL 191 12/07/2023   LDLCALC 116 (H) 12/07/2023   LDLCALC 139 (H) 06/08/2023   LDLCALC 117 (H) 07/01/2021   HDL 47 12/07/2023   TRIG 160 (H) 12/07/2023   TRIG 103 06/08/2023   TRIG 218 (H) 07/01/2021   ALT 36 12/07/2023   ALT 52 (H) 08/19/2023   AST 36 12/07/2023   AST 42 (H) 08/19/2023      Chemistry      Component Value Date/Time   NA 131 (L) 12/07/2023 1432   K 4.8 12/07/2023 1432   CL 99 12/07/2023 1432   CO2 20 12/07/2023 1432   BUN 8 12/07/2023 1432   CREATININE 0.79 12/07/2023 1432  Component Value Date/Time   CALCIUM  9.0 12/07/2023 1432   ALKPHOS 89 12/07/2023 1432   AST 36 12/07/2023 1432   ALT 36 12/07/2023 1432   BILITOT 0.4 12/07/2023 1432       The 10-year ASCVD risk score (Arnett DK, et al., 2019) is: 15.6%  Lab Results  Component Value Date   MICRALBCREAT 126 (H) 06/08/2023    A/P: Diabetes currently uncontrolled with a most recent A1c of 12% on 12/07/23, which is up from 9.8% on 08/19/23. Patient is *** able to  verbalize appropriate hypoglycemia management plan. Medication adherence appears ***. Control is suboptimal due to ***. -{Meds adjust:18428} basal insulin {basal insulins:33573}  *** units daily.  -{Meds adjust:18428} rapid insulin {bolus insulin:33574} ***.  -{Meds adjust:18428} GLP-1 {GLP1 options:33572} *** mg .  -{Meds adjust:18428} SGLT2-I {SGLT2i options:33571}*** mg daily.  -{Meds adjust:18428} metformin  ***.  -Patient educated on purpose, proper use, and potential adverse effects of ***.  -Extensively discussed pathophysiology of diabetes, recommended lifestyle interventions, dietary effects on blood sugar control.  -Counseled on s/sx of and management of hypoglycemia.  -Next A1c anticipated ***.   ASCVD risk - primary prevention in patient with diabetes. Last LDL is 116 mg/dL, not at goal of <29 mg/dL. ASCVD risk factors include *** and 10-year ASCVD risk score of ***. {Desc; low/moderate/high:110033} intensity statin indicated.  -{Meds adjust:18428} {statin therapies:33576} *** mg daily.  -{Meds adjust:18428} ezetimibe 10 mg daily   Hypertension longstanding *** currently ***. Blood pressure goal of <130/80 *** mmHg. Medication adherence ***. Blood pressure control is suboptimal due to ***. -{Meds adjust:18428} *** mg ***.  {pharmacisttime:33368}  Follow-up:  Pharmacist on *** PCP clinic visit on ***  Peyton CHARLENA Ferries, PharmD, CPP Clinical Pharmacist [facility] [phone]   "

## 2024-01-25 NOTE — Telephone Encounter (Signed)
 Copied from CRM [id number]. Topic: Appointments - Appointment Cancel/Reschedule >> Jan 25, 2024  9:12 AM Myrick T wrote: Patient/patient representative is calling to cancel an appointment. Refer to attachments for appointment information. Patient stated he is not feeling well and need to cancel his appt for today and reschedule. Please f/u with patient

## 2024-01-31 NOTE — Telephone Encounter (Signed)
" ° °  01/31/2024  Patrick [name] Single  DOB: 1975-07-30 MRN: [id number]  Attempted to contact patient for medication management/review. Voicemail box is not set up.  Will try again at a later time.   Kendrew Paci E. Marsh, PharmD Clinical Pharmacist [facility] [phone]  "

## 2024-02-02 ENCOUNTER — Telehealth: Payer: Self-pay

## 2024-02-02 NOTE — Telephone Encounter (Signed)
 Copied from CRM #[id number]. Topic: General - Other >> Feb 02, 2024  1:41 PM Ahlexyia S wrote: Reason for CRM: Pt returning call from Allyson Marsh about pt medication management/review. Attempted to reach Allyson but there was no answer. Pt would like a callback.

## 2024-02-03 NOTE — Progress Notes (Signed)
" ° °  02/03/2024  Patrick [name] Single  DOB: [birth year] MRN: [id number]  Attempted to contact patient for medication management/review. Voicemail box is not set up.   Will try again at a later time.   Jacqueli Pangallo E. Marsh, PharmD Clinical Pharmacist [facility] [phone]  "

## 2024-02-15 ENCOUNTER — Ambulatory Visit: Admitting: Physician Assistant

## 2024-02-15 DIAGNOSIS — Z23 Encounter for immunization: Secondary | ICD-10-CM

## 2024-03-30 ENCOUNTER — Encounter: Admitting: Physician Assistant
